# Patient Record
Sex: Female | Born: 1985
Health system: Southern US, Community
[De-identification: ages and names within clinical notes are randomized; demographics above are authoritative.]

## PROBLEM LIST (undated history)

## (undated) DIAGNOSIS — J309 Allergic rhinitis, unspecified: Secondary | ICD-10-CM

## (undated) DIAGNOSIS — N879 Dysplasia of cervix uteri, unspecified: Secondary | ICD-10-CM

## (undated) DIAGNOSIS — F419 Anxiety disorder, unspecified: Secondary | ICD-10-CM

## (undated) DIAGNOSIS — Z8619 Personal history of other infectious and parasitic diseases: Secondary | ICD-10-CM

## (undated) DIAGNOSIS — F32A Depression, unspecified: Secondary | ICD-10-CM

## (undated) HISTORY — DX: Personal history of other infectious and parasitic diseases: Z86.19

## (undated) HISTORY — DX: Allergic rhinitis, unspecified: J30.9

## (undated) HISTORY — DX: Dysplasia of cervix uteri, unspecified: N87.9

---
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
## 2010-01-01 ENCOUNTER — Ambulatory Visit: Payer: Self-pay | Admitting: Family Medicine

## 2010-04-23 ENCOUNTER — Ambulatory Visit (HOSPITAL_COMMUNITY): Admission: RE | Admit: 2010-04-23 | Discharge: 2010-04-23 | Payer: Self-pay | Admitting: Family Medicine

## 2010-06-27 ENCOUNTER — Inpatient Hospital Stay (HOSPITAL_COMMUNITY): Admission: AD | Admit: 2010-06-27 | Discharge: 2010-06-27 | Payer: Self-pay | Admitting: Obstetrics & Gynecology

## 2010-06-27 ENCOUNTER — Ambulatory Visit: Payer: Self-pay | Admitting: Obstetrics and Gynecology

## 2010-07-02 ENCOUNTER — Ambulatory Visit (HOSPITAL_COMMUNITY): Admission: RE | Admit: 2010-07-02 | Discharge: 2010-07-02 | Payer: Self-pay | Admitting: Family Medicine

## 2010-09-10 ENCOUNTER — Ambulatory Visit (HOSPITAL_COMMUNITY): Admission: RE | Admit: 2010-09-10 | Discharge: 2010-09-10 | Payer: Self-pay | Admitting: Family Medicine

## 2010-09-12 ENCOUNTER — Ambulatory Visit: Payer: Self-pay | Admitting: Obstetrics and Gynecology

## 2010-09-12 ENCOUNTER — Inpatient Hospital Stay (HOSPITAL_COMMUNITY)
Admission: AD | Admit: 2010-09-12 | Discharge: 2010-09-12 | Payer: Self-pay | Source: Home / Self Care | Admitting: Obstetrics & Gynecology

## 2010-09-12 ENCOUNTER — Inpatient Hospital Stay (HOSPITAL_COMMUNITY): Admission: AD | Admit: 2010-09-12 | Discharge: 2010-09-15 | Payer: Self-pay | Admitting: Obstetrics & Gynecology

## 2010-11-18 ENCOUNTER — Ambulatory Visit: Payer: Self-pay | Admitting: Family Medicine

## 2010-12-26 ENCOUNTER — Encounter: Payer: Self-pay | Admitting: *Deleted

## 2011-02-10 ENCOUNTER — Ambulatory Visit (INDEPENDENT_AMBULATORY_CARE_PROVIDER_SITE_OTHER): Payer: Medicaid Other | Admitting: Family Medicine

## 2011-02-10 DIAGNOSIS — J069 Acute upper respiratory infection, unspecified: Secondary | ICD-10-CM

## 2011-02-17 LAB — CBC
HCT: 37.8 % (ref 36.0–46.0)
Hemoglobin: 12.7 g/dL (ref 12.0–15.0)
MCH: 27.9 pg (ref 26.0–34.0)
MCHC: 33.6 g/dL (ref 30.0–36.0)
MCV: 83 fL (ref 78.0–100.0)
Platelets: 214 10*3/uL (ref 150–400)
RBC: 4.55 MIL/uL (ref 3.87–5.11)
RDW: 14.5 % (ref 11.5–15.5)
WBC: 13.8 10*3/uL — ABNORMAL HIGH (ref 4.0–10.5)

## 2011-02-17 LAB — RPR: RPR Ser Ql: NONREACTIVE

## 2011-02-19 LAB — URINALYSIS, ROUTINE W REFLEX MICROSCOPIC
Bilirubin Urine: NEGATIVE
Glucose, UA: NEGATIVE mg/dL
Hgb urine dipstick: NEGATIVE
Ketones, ur: 40 mg/dL — AB
Nitrite: NEGATIVE
Protein, ur: NEGATIVE mg/dL
Specific Gravity, Urine: <1.005 — ABNORMAL LOW (ref 1.005–1.030)
Urobilinogen, UA: 0.2 mg/dL (ref 0.0–1.0)
pH: 5.5 (ref 5.0–8.0)

## 2011-02-19 LAB — FETAL FIBRONECTIN: Fetal Fibronectin: NEGATIVE

## 2011-04-05 ENCOUNTER — Inpatient Hospital Stay (HOSPITAL_COMMUNITY): Admission: AD | Admit: 2011-04-05 | Payer: Self-pay | Source: Home / Self Care | Admitting: Obstetrics & Gynecology

## 2011-10-04 ENCOUNTER — Other Ambulatory Visit: Payer: BC Managed Care – PPO

## 2011-10-31 ENCOUNTER — Ambulatory Visit (INDEPENDENT_AMBULATORY_CARE_PROVIDER_SITE_OTHER): Payer: Self-pay | Admitting: Medical

## 2011-10-31 ENCOUNTER — Encounter: Payer: Self-pay | Admitting: Medical

## 2011-10-31 ENCOUNTER — Encounter: Payer: Self-pay | Admitting: Family Medicine

## 2011-10-31 VITALS — BP 90/58 | HR 88 | Temp 98.5°F | Resp 16 | Wt 179.0 lb

## 2011-10-31 DIAGNOSIS — H6690 Otitis media, unspecified, unspecified ear: Secondary | ICD-10-CM

## 2011-10-31 DIAGNOSIS — J069 Acute upper respiratory infection, unspecified: Secondary | ICD-10-CM

## 2011-10-31 MED ORDER — AMOXICILLIN 875 MG PO TABS: 875.0000 mg | ORAL_TABLET | Freq: Two times a day (BID) | ORAL | Status: AC

## 2011-10-31 NOTE — Patient Instructions (Signed)

## 2011-10-31 NOTE — Progress Notes (Signed)
Subjective:   HPI  Carol May is a 25 y.o. female who presents with three-day history of sore throat, hard to swallow, cough, runny nose, a few episodes of nausea and vomiting last 2 days. Using nothing for her symptoms. Denies sick contacts.  She is currently 4 months pregnant, and pregnancy has been uncomplicated.  No other aggravating or relieving factors.    No other c/o.  The following portions of the patient's history were reviewed and updated as appropriate: allergies, current medications, past family history, past medical history, past social history, past surgical history and problem list.  Past Medical History  Diagnosis Date  . Pregnancy as incidental finding    Review of Systems Constitutional: -fever, -chills, -sweats, -unexpected -weight change,-fatigue ENT: +runny nose, -ear pain, +sore throat Cardiology:  -chest pain, -palpitations, -edema Respiratory: +cough, -shortness of breath, -wheezing Gastroenterology: -abdominal pain, -nausea, +vomiting, -diarrhea, -constipation Hematology: -bleeding or bruising problems Musculoskeletal: -arthralgias, -myalgias, -joint swelling, -back pain Ophthalmology: -vision changes Urology: -dysuria, -difficulty urinating, -hematuria, -urinary frequency, -urgency Neurology: -headache, -weakness, -tingling, -numbness    Objective:   Filed Vitals:   10/31/11 1355  BP: 90/58  Pulse: 88  Temp: 98.5 F (36.9 C)  Resp: 16    General appearance: Alert, WD/WN, no distress, mildly ill appearing                             Skin: warm, no rash                           Head: no sinus tenderness                            Eyes: conjunctiva normal, corneas clear, PERRLA                            Ears: right TM with mild erythema, left TM pearly                           Nose: septum midline, turbinates swollen, with erythema and clear discharge             Mouth/throat: MMM, tongue normal, mild pharyngeal erythema             Neck: supple, no adenopathy, no thyromegaly, nontender                          Heart: RRR, normal S1, S2, no murmurs                         Lungs: CTA bilaterally, no wheezes, rales, or rhonchi     Assessment and Plan:   Encounter Diagnoses  Name Primary?  . Otitis media Yes  . URI (upper respiratory infection)    Discussed diagnosis and treatment of URI and mild otitis media.  Suggested symptomatic OTC remedies.  If your ear pain worsens or symptoms worsen in general can begin amoxicillin, otherwise we will treat this as conservatively as possible given that she is pregnant.  Nasal saline spray for congestion.  Tylenol or Ibuprofen OTC for fever and malaise.  Call/return in 2-3 days if symptoms aren't resolving.  Prescription for amoxicillin given.

## 2011-11-30 LAB — CBC
HCT: 36 % (ref 36–46)
Hemoglobin: 12.3 g/dL (ref 12.0–16.0)
Platelets: 195 10*3/uL (ref 150–399)

## 2011-11-30 LAB — ABO/RH: RH Type: POSITIVE

## 2011-11-30 LAB — SICKLE CELL SCREEN: Sickle Cell Screen: NORMAL

## 2011-11-30 LAB — RUBELLA ANTIBODY, IGM: Rubella: IMMUNE

## 2011-11-30 LAB — HIV ANTIBODY (ROUTINE TESTING W REFLEX)
HIV: NONREACTIVE
HIV: NONREACTIVE

## 2011-11-30 LAB — HEPATITIS B SURFACE ANTIGEN: Hepatitis B Surface Ag: NEGATIVE

## 2011-11-30 LAB — RPR: RPR: NONREACTIVE

## 2011-11-30 LAB — ANTIBODY SCREEN: Antibody Screen: NEGATIVE

## 2011-11-30 LAB — VARICELLA ZOSTER ANTIBODY, IGG: Varicella: IMMUNE

## 2011-12-06 NOTE — L&D Delivery Note (Signed)
Delivery Note At 3:38 PM a viable female was delivered via  (LOA, easy delivery of the head, easy delivery of shoulders, baby placed on pt abd after bulb suction, cord doubly clamped and cut by FOB ).  APGAR:8, 9 ; weight pending Placenta status: schultze 3 vessel cord Anesthesia: Epidural  Episiotomy: none Lacerations: none Suture Repair: none Est. Blood Loss (mL): 200  Mom to postpartum.  Baby to rooming in does not plan circumcision.  Romulo Okray 04/21/2012, 4:02 PM

## 2011-12-08 LAB — GC/CHLAMYDIA PROBE AMP, GENITAL
Chlamydia: NEGATIVE
Gonorrhea: NEGATIVE

## 2012-02-27 ENCOUNTER — Encounter (INDEPENDENT_AMBULATORY_CARE_PROVIDER_SITE_OTHER): Payer: Medicaid Other | Admitting: Obstetrics and Gynecology

## 2012-02-27 DIAGNOSIS — Z331 Pregnant state, incidental: Secondary | ICD-10-CM

## 2012-02-27 LAB — RPR: RPR: NONREACTIVE

## 2012-02-27 LAB — CBC
HCT: 34 % — AB (ref 36–46)
Hemoglobin: 10.7 g/dL — AB (ref 12.0–16.0)

## 2012-03-13 ENCOUNTER — Ambulatory Visit (INDEPENDENT_AMBULATORY_CARE_PROVIDER_SITE_OTHER): Payer: Medicaid Other | Admitting: Obstetrics and Gynecology

## 2012-03-13 ENCOUNTER — Encounter: Payer: Self-pay | Admitting: Obstetrics and Gynecology

## 2012-03-13 VITALS — BP 92/62 | Ht 62.0 in | Wt 188.0 lb

## 2012-03-13 DIAGNOSIS — Z331 Pregnant state, incidental: Secondary | ICD-10-CM

## 2012-03-13 NOTE — Progress Notes (Signed)
No complaints FKCs GBS at [place] Glucola tomorrow (not resulted by lab at last visit)

## 2012-03-14 ENCOUNTER — Other Ambulatory Visit: Payer: Medicaid Other

## 2012-03-15 ENCOUNTER — Other Ambulatory Visit (INDEPENDENT_AMBULATORY_CARE_PROVIDER_SITE_OTHER): Payer: Medicaid Other

## 2012-03-15 DIAGNOSIS — Z331 Pregnant state, incidental: Secondary | ICD-10-CM

## 2012-03-15 LAB — POCT UA - GLUCOSE/PROTEIN
Glucose, UA: NEGATIVE
Protein, UA: NEGATIVE

## 2012-03-16 LAB — GLUCOSE TOLERANCE, 1 HOUR: Glucose, 1 Hour GTT: 82 mg/dL (ref 70–140)

## 2012-03-27 ENCOUNTER — Encounter: Payer: Medicaid Other | Admitting: Obstetrics and Gynecology

## 2012-03-28 ENCOUNTER — Ambulatory Visit (INDEPENDENT_AMBULATORY_CARE_PROVIDER_SITE_OTHER): Payer: Medicaid Other | Admitting: Obstetrics and Gynecology

## 2012-03-28 ENCOUNTER — Encounter: Payer: Self-pay | Admitting: Obstetrics and Gynecology

## 2012-03-28 VITALS — BP 96/58 | Wt 189.0 lb

## 2012-03-28 DIAGNOSIS — Z331 Pregnant state, incidental: Secondary | ICD-10-CM

## 2012-03-28 NOTE — Progress Notes (Signed)
No complaints Labor signs reviewed GBS/GC/Chlamydia done Glucola 82

## 2012-03-28 NOTE — Progress Notes (Signed)
Pt. Stated no issues today / gbs today.

## 2012-03-29 LAB — GC/CHLAMYDIA PROBE AMP, GENITAL
Chlamydia, DNA Probe: NEGATIVE
GC Probe Amp, Genital: NEGATIVE

## 2012-03-30 LAB — STREP B DNA PROBE: GBSP: POSITIVE

## 2012-04-04 ENCOUNTER — Encounter: Payer: Medicaid Other | Admitting: Obstetrics and Gynecology

## 2012-04-05 ENCOUNTER — Ambulatory Visit (INDEPENDENT_AMBULATORY_CARE_PROVIDER_SITE_OTHER): Payer: Medicaid Other | Admitting: Obstetrics and Gynecology

## 2012-04-05 ENCOUNTER — Encounter: Payer: Self-pay | Admitting: Obstetrics and Gynecology

## 2012-04-05 VITALS — BP 110/60 | Wt 190.0 lb

## 2012-04-05 DIAGNOSIS — Z3480 Encounter for supervision of other normal pregnancy, unspecified trimester: Secondary | ICD-10-CM

## 2012-04-05 NOTE — Progress Notes (Signed)
No complaints today but feels her cervix opening.  GBSpos/ GCneg/CT neg Doing Well FKCs and Labor Precautions RTO 1wk

## 2012-04-09 ENCOUNTER — Telehealth: Payer: Self-pay | Admitting: Obstetrics and Gynecology

## 2012-04-09 NOTE — Telephone Encounter (Signed)
In epic

## 2012-04-09 NOTE — Telephone Encounter (Signed)
Spoke with pt rgd msg informed pt ok to take plain claratin or plain zytrec pt voice understanding

## 2012-04-11 ENCOUNTER — Ambulatory Visit (INDEPENDENT_AMBULATORY_CARE_PROVIDER_SITE_OTHER): Payer: Medicaid Other | Admitting: Obstetrics and Gynecology

## 2012-04-11 ENCOUNTER — Encounter: Payer: Self-pay | Admitting: Obstetrics and Gynecology

## 2012-04-11 VITALS — BP 110/62 | Wt 193.0 lb

## 2012-04-11 DIAGNOSIS — Z331 Pregnant state, incidental: Secondary | ICD-10-CM

## 2012-04-11 NOTE — Progress Notes (Signed)
URI VS allergies, c/o mucous. Suggest Zyrtec +/- Mucinex

## 2012-04-11 NOTE — Progress Notes (Signed)
Pt c/o pain in lower abd almost every day lasting around 15 min. Pt also complains of cold sx, congestion. Wanting to know otc meds that can be taken. Pt declines VE

## 2012-04-18 ENCOUNTER — Encounter: Payer: Self-pay | Admitting: Obstetrics and Gynecology

## 2012-04-18 ENCOUNTER — Ambulatory Visit (INDEPENDENT_AMBULATORY_CARE_PROVIDER_SITE_OTHER): Payer: Medicaid Other | Admitting: Obstetrics and Gynecology

## 2012-04-18 VITALS — BP 98/52 | Wt 193.0 lb

## 2012-04-18 DIAGNOSIS — Z331 Pregnant state, incidental: Secondary | ICD-10-CM

## 2012-04-18 NOTE — Progress Notes (Signed)
Well. No complaints. Increased discharge.

## 2012-04-18 NOTE — Progress Notes (Signed)
Pt has no complaints and would like cx checked JM

## 2012-04-21 ENCOUNTER — Encounter (HOSPITAL_COMMUNITY): Payer: Self-pay | Admitting: *Deleted

## 2012-04-21 ENCOUNTER — Encounter (HOSPITAL_COMMUNITY): Payer: Self-pay

## 2012-04-21 ENCOUNTER — Encounter (HOSPITAL_COMMUNITY): Payer: Self-pay | Admitting: Anesthesiology

## 2012-04-21 ENCOUNTER — Inpatient Hospital Stay (HOSPITAL_COMMUNITY)
Admission: AD | Admit: 2012-04-21 | Discharge: 2012-04-22 | DRG: 775 | Disposition: A | Discharge status: Home / Self Care | Payer: Medicaid Other | Source: Ambulatory Visit | Attending: Obstetrics and Gynecology | Admitting: Obstetrics and Gynecology

## 2012-04-21 ENCOUNTER — Inpatient Hospital Stay (HOSPITAL_COMMUNITY): Payer: Medicaid Other | Admitting: Anesthesiology

## 2012-04-21 DIAGNOSIS — O99892 Other specified diseases and conditions complicating childbirth: Principal | ICD-10-CM | POA: Diagnosis present

## 2012-04-21 DIAGNOSIS — IMO0001 Reserved for inherently not codable concepts without codable children: Secondary | ICD-10-CM

## 2012-04-21 DIAGNOSIS — O99820 Streptococcus B carrier state complicating pregnancy: Secondary | ICD-10-CM

## 2012-04-21 DIAGNOSIS — Z22330 Carrier of Group B streptococcus: Secondary | ICD-10-CM

## 2012-04-21 LAB — CBC
HCT: 34.8 % — ABNORMAL LOW (ref 36.0–46.0)
Hemoglobin: 11.2 g/dL — ABNORMAL LOW (ref 12.0–15.0)
MCH: 26.4 pg (ref 26.0–34.0)
MCHC: 32.2 g/dL (ref 30.0–36.0)
MCV: 82.1 fL (ref 78.0–100.0)
Platelets: 181 10*3/uL (ref 150–400)
RBC: 4.24 MIL/uL (ref 3.87–5.11)
RDW: 14.1 % (ref 11.5–15.5)
WBC: 10.1 10*3/uL (ref 4.0–10.5)

## 2012-04-21 LAB — RPR: RPR Ser Ql: NONREACTIVE

## 2012-04-21 MED ORDER — PRENATAL MULTIVITAMIN CH
1.0000 | ORAL_TABLET | Freq: Every day | ORAL | Status: DC
  Administered 2012-04-22: 1 via ORAL
  Filled 2012-04-21: qty 1

## 2012-04-21 MED ORDER — LIDOCAINE HCL (PF) 1 % IJ SOLN
INTRAMUSCULAR | Status: DC | PRN
  Administered 2012-04-21 (×2): 4 mL

## 2012-04-21 MED ORDER — ONDANSETRON HCL 4 MG/2ML IJ SOLN: 4.0000 mg | INTRAMUSCULAR | Status: DC | PRN

## 2012-04-21 MED ORDER — IBUPROFEN 600 MG PO TABS
600.0000 mg | ORAL_TABLET | Freq: Four times a day (QID) | ORAL | Status: DC | PRN
  Administered 2012-04-21: 600 mg via ORAL
  Filled 2012-04-21: qty 1

## 2012-04-21 MED ORDER — OXYCODONE-ACETAMINOPHEN 5-325 MG PO TABS: 1.0000 | ORAL_TABLET | ORAL | Status: DC | PRN

## 2012-04-21 MED ORDER — EPHEDRINE 5 MG/ML INJ
10.0000 mg | INTRAVENOUS | Status: DC | PRN
  Filled 2012-04-21: qty 4

## 2012-04-21 MED ORDER — LACTATED RINGERS IV SOLN
500.0000 mL | Freq: Once | INTRAVENOUS | Status: AC
  Administered 2012-04-21: 500 mL via INTRAVENOUS

## 2012-04-21 MED ORDER — DIPHENHYDRAMINE HCL 50 MG/ML IJ SOLN: 12.5000 mg | INTRAMUSCULAR | Status: DC | PRN

## 2012-04-21 MED ORDER — PENICILLIN G POTASSIUM 5000000 UNITS IJ SOLR
5.0000 10*6.[IU] | Freq: Once | INTRAVENOUS | Status: AC
  Administered 2012-04-21: 5 10*6.[IU] via INTRAVENOUS
  Filled 2012-04-21: qty 5

## 2012-04-21 MED ORDER — BENZOCAINE-MENTHOL 20-0.5 % EX AERO: 1.0000 "application " | INHALATION_SPRAY | CUTANEOUS | Status: DC | PRN

## 2012-04-21 MED ORDER — PENICILLIN G POTASSIUM 5000000 UNITS IJ SOLR
2.5000 10*6.[IU] | INTRAVENOUS | Status: DC
  Administered 2012-04-21 (×2): 2.5 10*6.[IU] via INTRAVENOUS
  Filled 2012-04-21 (×5): qty 2.5

## 2012-04-21 MED ORDER — WITCH HAZEL-GLYCERIN EX PADS: 1.0000 "application " | MEDICATED_PAD | CUTANEOUS | Status: DC | PRN

## 2012-04-21 MED ORDER — OXYTOCIN 20 UNITS IN LACTATED RINGERS INFUSION - SIMPLE
125.0000 mL/h | Freq: Once | INTRAVENOUS | Status: AC
  Administered 2012-04-21: 125 mL/h via INTRAVENOUS

## 2012-04-21 MED ORDER — BUTORPHANOL TARTRATE 2 MG/ML IJ SOLN: 1.0000 mg | INTRAMUSCULAR | Status: DC | PRN

## 2012-04-21 MED ORDER — FENTANYL 2.5 MCG/ML BUPIVACAINE 1/10 % EPIDURAL INFUSION (WH - ANES)
INTRAMUSCULAR | Status: DC | PRN
  Administered 2012-04-21: 13 mL/h via EPIDURAL

## 2012-04-21 MED ORDER — ONDANSETRON HCL 4 MG PO TABS: 4.0000 mg | ORAL_TABLET | ORAL | Status: DC | PRN

## 2012-04-21 MED ORDER — FENTANYL 2.5 MCG/ML BUPIVACAINE 1/10 % EPIDURAL INFUSION (WH - ANES)
14.0000 mL/h | INTRAMUSCULAR | Status: DC
  Administered 2012-04-21 (×2): 14 mL/h via EPIDURAL
  Filled 2012-04-21 (×3): qty 60

## 2012-04-21 MED ORDER — LACTATED RINGERS IV SOLN
500.0000 mL | INTRAVENOUS | Status: DC | PRN
Start: 2012-04-21 — End: 2012-04-21

## 2012-04-21 MED ORDER — LIDOCAINE HCL (PF) 1 % IJ SOLN: 30.0000 mL | INTRAMUSCULAR | Status: DC | PRN

## 2012-04-21 MED ORDER — FLEET ENEMA 7-19 GM/118ML RE ENEM: 1.0000 | ENEMA | RECTAL | Status: DC | PRN

## 2012-04-21 MED ORDER — DIPHENHYDRAMINE HCL 25 MG PO CAPS: 25.0000 mg | ORAL_CAPSULE | Freq: Four times a day (QID) | ORAL | Status: DC | PRN

## 2012-04-21 MED ORDER — LACTATED RINGERS IV SOLN
INTRAVENOUS | Status: DC
  Administered 2012-04-21 (×2): via INTRAVENOUS

## 2012-04-21 MED ORDER — TERBUTALINE SULFATE 1 MG/ML IJ SOLN: 0.2500 mg | Freq: Once | INTRAMUSCULAR | Status: DC | PRN

## 2012-04-21 MED ORDER — SIMETHICONE 80 MG PO CHEW: 80.0000 mg | CHEWABLE_TABLET | ORAL | Status: DC | PRN

## 2012-04-21 MED ORDER — ZOLPIDEM TARTRATE 5 MG PO TABS: 5.0000 mg | ORAL_TABLET | Freq: Every evening | ORAL | Status: DC | PRN

## 2012-04-21 MED ORDER — EPHEDRINE 5 MG/ML INJ: 10.0000 mg | INTRAVENOUS | Status: DC | PRN

## 2012-04-21 MED ORDER — OXYTOCIN 20 UNITS IN LACTATED RINGERS INFUSION - SIMPLE
1.0000 m[IU]/min | INTRAVENOUS | Status: DC
Start: 2012-04-21 — End: 2012-04-21
  Administered 2012-04-21: 2 m[IU]/min via INTRAVENOUS

## 2012-04-21 MED ORDER — TETANUS-DIPHTH-ACELL PERTUSSIS 5-2.5-18.5 LF-MCG/0.5 IM SUSP
0.5000 mL | Freq: Once | INTRAMUSCULAR | Status: AC
  Administered 2012-04-22: 0.5 mL via INTRAMUSCULAR
  Filled 2012-04-21: qty 0.5

## 2012-04-21 MED ORDER — ACETAMINOPHEN 325 MG PO TABS: 650.0000 mg | ORAL_TABLET | ORAL | Status: DC | PRN

## 2012-04-21 MED ORDER — IBUPROFEN 600 MG PO TABS
600.0000 mg | ORAL_TABLET | Freq: Four times a day (QID) | ORAL | Status: DC
  Administered 2012-04-22 (×3): 600 mg via ORAL
  Filled 2012-04-21 (×3): qty 1

## 2012-04-21 MED ORDER — CITRIC ACID-SODIUM CITRATE 334-500 MG/5ML PO SOLN: 30.0000 mL | ORAL | Status: DC | PRN

## 2012-04-21 MED ORDER — ONDANSETRON HCL 4 MG/2ML IJ SOLN: 4.0000 mg | Freq: Four times a day (QID) | INTRAMUSCULAR | Status: DC | PRN

## 2012-04-21 MED ORDER — PHENYLEPHRINE 40 MCG/ML (10ML) SYRINGE FOR IV PUSH (FOR BLOOD PRESSURE SUPPORT)
80.0000 ug | PREFILLED_SYRINGE | INTRAVENOUS | Status: DC | PRN
  Filled 2012-04-21: qty 5

## 2012-04-21 MED ORDER — PHENYLEPHRINE 40 MCG/ML (10ML) SYRINGE FOR IV PUSH (FOR BLOOD PRESSURE SUPPORT): 80.0000 ug | PREFILLED_SYRINGE | INTRAVENOUS | Status: DC | PRN

## 2012-04-21 MED ORDER — OXYTOCIN BOLUS FROM INFUSION
500.0000 mL | Freq: Once | INTRAVENOUS | Status: DC
  Filled 2012-04-21: qty 1000
  Filled 2012-04-21: qty 500

## 2012-04-21 MED ORDER — SENNOSIDES-DOCUSATE SODIUM 8.6-50 MG PO TABS
2.0000 | ORAL_TABLET | Freq: Every day | ORAL | Status: DC
Start: 2012-04-21 — End: 2012-04-22
  Administered 2012-04-21: 2 via ORAL

## 2012-04-21 MED ORDER — LANOLIN HYDROUS EX OINT: TOPICAL_OINTMENT | CUTANEOUS | Status: DC | PRN

## 2012-04-21 MED ORDER — DIBUCAINE 1 % RE OINT: 1.0000 "application " | TOPICAL_OINTMENT | RECTAL | Status: DC | PRN

## 2012-04-21 NOTE — Anesthesia Postprocedure Evaluation (Signed)
  Anesthesia Post-op Note  Patient: Carol May  Procedure(s) Performed: * No procedures listed *  Patient Location: PACU and Mother/Baby  Anesthesia Type: Epidural  Level of Consciousness: awake, alert  and oriented  Airway and Oxygen Therapy: Patient Spontanous Breathing     Post-op Assessment: Patient's Cardiovascular Status Stable and Respiratory Function Stable  Post-op Vital Signs: stable  Complications: No apparent anesthesia complications

## 2012-04-21 NOTE — Progress Notes (Signed)
Delivery of live viable female by Maryln Manuel, CNM. APGARS 8,9

## 2012-04-21 NOTE — Progress Notes (Signed)
Comfortable with epidural O VSS     Fhts 120 LTV Mod     abd soft, gravid, nt     uc q 3 mild to mod     Vag 5 90 -1 VTX arm mod amount clear fluid A not in active labor     Probable inadequate uc P ARM and IUPC placed with pt consent, continue care Lavera Guise, CNM

## 2012-04-21 NOTE — Progress Notes (Addendum)
Comfortable with epidural O VSS     Fhs 130s LTV Mod     uc q 5-7 mod     abd soft between uc     Vag 5 80 -2 VTX I A inadequate uc P IV pitocin, arm if indicated, pt concurs with plan of care, continue care, Lavera Guise, CNM EFW 7#12

## 2012-04-21 NOTE — Progress Notes (Signed)
Comfortable, some pressure O VSS     Fhts cateory 1     abd soft between uc     uc q 2-3 adequate      Vag 6 100 -1 VTX R clear A active labor P continue care Rosette Reveal, CNM

## 2012-04-21 NOTE — MAU Note (Signed)
Patient is here for labor eval. She states that ctx q3-34mins since midnight. She reports good fetal movement. Denies any vaginal bleeding or lof.

## 2012-04-21 NOTE — Anesthesia Procedure Notes (Signed)
Epidural Patient location during procedure: OB Start time: 04/21/2012 5:37 AM  Staffing Anesthesiologist: Relda Agosto A. Performed by: anesthesiologist   Preanesthetic Checklist Completed: patient identified, site marked, surgical consent, pre-op evaluation, timeout performed, IV checked, risks and benefits discussed and monitors and equipment checked  Epidural Patient position: sitting Prep: site prepped and draped and DuraPrep Patient monitoring: continuous pulse ox and blood pressure Approach: midline Injection technique: LOR air  Needle:  Needle type: Tuohy  Needle gauge: 17 G Needle length: 9 cm Needle insertion depth: 8 cm Catheter type: closed end flexible Catheter size: 19 Gauge Catheter at skin depth: 13 cm Test dose: negative and Other  Assessment Events: blood not aspirated, injection not painful, no injection resistance, negative IV test and no paresthesia  Additional Notes Patient identified. Risks and benefits discussed including failed block, incomplete  Pain control, post dural puncture headache, nerve damage, paralysis, blood pressure Changes, nausea, vomiting, reactions to medications-both toxic and allergic and post Partum back pain. All questions were answered. Patient expressed understanding and wished to proceed. Sterile technique was used throughout procedure. Epidural site was Dressed with sterile barrier dressing. No paresthesias, signs of intravascular injection Or signs of intrathecal spread were encountered.  Patient was more comfortable after the epidural was dosed. Please see RN's note for documentation of vital signs and FHR which are stable.

## 2012-04-21 NOTE — H&P (Signed)
Carol May is a 26 y.o. female, G2P1001 at 40 weeks, presenting with contractions every 3-5 minutes x several hours.  Denies leaking or bleeding, reports +FM.  Cervix was 1 cm on last exam.  Pregnancy remarkable for: Late to care at 20 weeks . History of present pregnancy: Patient entered care at 20 weeks.  EDC of 04/21/12 was established by LMP and in agreement with 20 week [place].  Anatomy scan was done at 20-21 weeks, with normal findings and an anterior/fundal placenta.   Her prenatal course was essentially uncomplicated.  Further signficant events were positive GBS noted at 36 weeks. At her last evalution, she was 1 cm.  History OB History    Grav Para Term Preterm Abortions TAB SAB Ect Mult Living   2 1 1  0 0 0 0 0 0 1    #1--2011.. SVB female, 7+5 at 41 weeks.  9 hour labor, epidural. #2--Current.  Same partner  Past Medical History  Diagnosis Date  . Pregnancy as incidental finding   . No pertinent past medical history   . Bacterial vaginosis   . H/O varicella    Past Surgical History  Procedure Date  . No past surgeries    Family History: family history is not on file.  Non-contributory. Social History:  reports that she has never smoked. She does not have any smokeless tobacco history on file. She reports that she does not drink alcohol or use illicit drugs.  Patient is Hispanic in ethnicity, of the [facility] faith, and has 2 years of college. She is a SAHM.  FOB, Collier Bullock, is high-school educated and employed as a Designer, fashion/clothing.  ROS:  Contractions q 3-5 minutes, + FM.  Dilation: 3.5 Effacement (%): 80 Station: -1 Exam by:: v. Rapheal Masso cnm Blood pressure 113/71, pulse 84, temperature 97.4 F (36.3 C), temperature source Oral, resp. rate 18, last menstrual period 07/16/2011.  Exam Physical Exam  Chest clear Heart RRR without murmur Abd gravid, NT Pelvic--as above, with intact BOW, cervix slightly posterior Ext WNL  FHR reactive, with baseline 100-110 UCs q  3-5 minutes, moderate quality.  Prenatal labs: ABO, Rh: A/Positive/-- (12/26 0000) Antibody: Negative (12/26 0000) Rubella: Immune (12/26 0000) RPR: Nonreactive (03/25 0000)  HBsAg: Negative (12/26 0000)  HIV: Non-reactive, Non-reactive (12/26 0000)  GBS: POSITIVE (04/24 1412)  Hgb 12.3 at NOB/10.7 at 28 weeks Glucola WNL. GC, chlamydia negative at NOB and 36 weeks Declined genetic screening  Assessment/Plan: IUP at 40 weeks Early labor Positive GBS Low FHR baseline, but reactive  Plan: Admit to [facility] per consult with Dr. Stefano Gaul. Routine [facility] orders Plan continuous monitoring GBS prophylaxis with PCN G. Epidural prn as pt desires. Will observe labor progress, with AROM as appropriate.   Nigel Bridgeman 04/21/2012, 4:38 AM

## 2012-04-21 NOTE — Progress Notes (Signed)
Comfortable, some pressure O VSS      Fhts 120s LTV mod      uc q 2-3 50 mmHg, 250 MVU      Vag by RN at 1425 7 100 -1 A adequate uc     Active labor P continue care, Lavera Guise, CNM

## 2012-04-21 NOTE — Progress Notes (Signed)
Dr. Stefano Gaul updated per telephone on pt status per last note, will continue care. Lavera Guise, CNM

## 2012-04-21 NOTE — Anesthesia Preprocedure Evaluation (Signed)

## 2012-04-22 ENCOUNTER — Encounter (HOSPITAL_COMMUNITY): Payer: Self-pay | Admitting: *Deleted

## 2012-04-22 LAB — CBC
HCT: 31.6 % — ABNORMAL LOW (ref 36.0–46.0)
Hemoglobin: 10.2 g/dL — ABNORMAL LOW (ref 12.0–15.0)
MCH: 26.5 pg (ref 26.0–34.0)
MCHC: 32.3 g/dL (ref 30.0–36.0)
MCV: 82.1 fL (ref 78.0–100.0)
Platelets: 155 10*3/uL (ref 150–400)
RBC: 3.85 MIL/uL — ABNORMAL LOW (ref 3.87–5.11)
RDW: 14 % (ref 11.5–15.5)
WBC: 11.8 10*3/uL — ABNORMAL HIGH (ref 4.0–10.5)

## 2012-04-22 MED ORDER — IBUPROFEN 600 MG PO TABS: 600.0000 mg | ORAL_TABLET | Freq: Four times a day (QID) | ORAL | Status: AC | PRN

## 2012-04-22 NOTE — Progress Notes (Signed)
S: comfortable, little bleeding, slept     breastfeeding O VSS     abd soft, nt, ff      sm  flowperineum clean intact     -Homans sign bilaterally,       No edema A normal involution     Lactating     PP day 1 P continue care, discussed birth control options undecided Lavera Guise, CNM

## 2012-04-22 NOTE — Discharge Summary (Signed)
Physician Discharge Summary  Patient ID: Carol May MRN: [id number] DOB/AGE: 1986-11-07 26 y.o.  Admit date: 04/21/2012 Discharge date: 04/22/2012  Admission Diagnoses:40 week IUP early labor  Discharge Diagnoses:  Active Problems:  Vaginal delivery normal involution lactating Discharged Condition: stable  Hospital Course: 40 week IUP spontaneous labor, vag delivery intact, normal involution lactating  Consults: None  Significant Diagnostic Studies: labs:  Hemoglobin & Hematocrit     Component Value Date/Time   HGB 10.2* 04/22/2012 0529   HCT 31.6* 04/22/2012 0529     Treatments: IV hydration  Discharge Exam: Blood pressure 110/75, pulse 72, temperature 98.3 F (36.8 C), temperature source Oral, resp. rate 18, height 5\' 2"  (1.575 m), weight 193 lb (87.544 kg), last menstrual period 07/16/2011, unknown if currently breastfeeding. General appearance: alert, cooperative and no distress see PE progress note  Disposition:   Discharge Orders    Future Appointments: Provider: Department: Dept Phone: Center:   04/24/2012 11:40 AM Esmeralda Arthur, MD [facility] [phone] None     Medication List  As of 04/22/2012  4:33 PM   STOP taking these medications         prenatal multivitamin 60-1 MG tablet         TAKE these medications         ibuprofen 600 MG tablet   Commonly known as: ADVIL,MOTRIN   Take 1 tablet (600 mg total) by mouth every 6 (six) hours as needed for pain.           Follow-up Information    Follow up in 6 weeks.       undecided regarding birth control, [facility] handbook.  SignedLavera Guise 04/22/2012, 4:33 PM

## 2012-04-22 NOTE — Discharge Instructions (Signed)
Vaginal Delivery Care After  Change your pad on each trip to the bathroom.   Wipe gently with toilet paper during your hospital stay. Always wipe from front to back. A spray bottle with warm tap water could also be used or a towelette if available.   Place your soiled pad and toilet paper in a bathroom wastebasket with a plastic bag liner.   During your hospital stay, save any clots. If you pass a clot while on the toilet, do not flush it. Also, if your vaginal flow seems excessive to you, notify nursing personnel.   The first time you get out of bed after delivery, wait for assistance from a nurse. Do not get up alone at any time if you feel weak or dizzy.   Bend and extend your ankles forcefully so that you feel the calves of your legs get hard. Do this 6 times every hour when you are in bed and awake.   Do not sit with one foot under you, dangle your legs over the edge of the bed, or maintain a position that hinders the circulation in your legs.   Many women experience after pains for 2 to 3 days after delivery. These after pains are mild uterine contractions. Ask the nurse for a pain medication if you need something for this. Sometimes breastfeeding stimulates after pains; if you find this to be true, ask for the medication  -  hour before the next feeding.   For you and your infant's protection, do not go beyond the door(s) of the obstetric unit. Do not carry your baby in your arms in the hallway. When taking your baby to and from your room, put your baby in the bassinet and push the bassinet.   Mothers may have their babies in their room as much as they desire.  Document Released: 11/18/2000 Document Revised: 11/10/2011 Document Reviewed: 10/19/2007 Exit[employer] Patient Information 2012 [employer]. Breastfeeding BENEFITS OF BREASTFEEDING For the baby  The first milk (colostrum) helps the baby's digestive system function better.   There are antibodies from the mother in the milk  that help the baby fight off infections.   The baby has a lower incidence of asthma, allergies, and SIDS (sudden infant death syndrome).   The nutrients in breast milk are better than formulas for the baby and helps the baby's brain grow better.   Babies who breastfeed have less gas, colic, and constipation.  For the mother  Breastfeeding helps develop a very special bond between mother and baby.   It is more convenient, always available at the correct temperature and cheaper than formula feeding.   It burns calories in the mother and helps with losing weight that was gained during pregnancy.   It makes the uterus contract back down to normal size faster and slows bleeding following delivery.   Breastfeeding mothers have a lower risk of developing breast cancer.  NURSE FREQUENTLY  A healthy, full-term baby may breastfeed as often as every hour or space his or her feedings to every 3 hours.   How often to nurse will vary from baby to baby. Watch your baby for signs of hunger, not the clock.   Nurse as often as the baby requests, or when you feel the need to reduce the fullness of your breasts.   Awaken the baby if it has been 3 to 4 hours since the last feeding.   Frequent feeding will help the mother make more milk and will prevent problems like   sore nipples and engorgement of the breasts.  BABY'S POSITION AT THE BREAST  Whether lying down or sitting, be sure that the baby's tummy is facing your tummy.   Support the breast with 4 fingers underneath the breast and the thumb above. Make sure your fingers are well away from the nipple and baby's mouth.   Stroke the baby's lips and cheek closest to the breast gently with your finger or nipple.   When the baby's mouth is open wide enough, place all of your nipple and as much of the dark area around the nipple as possible into your baby's mouth.   Pull the baby in close so the tip of the nose and the baby's cheeks touch the breast  during the feeding.  FEEDINGS  The length of each feeding varies from baby to baby and from feeding to feeding.   The baby must suck about 2 to 3 minutes for your milk to get to him or her. This is called a "let down." For this reason, allow the baby to feed on each breast as long as he or she wants. Your baby will end the feeding when he or she has received the right balance of nutrients.   To break the suction, put your finger into the corner of the baby's mouth and slide it between his or her gums before removing your breast from his or her mouth. This will help prevent sore nipples.  REDUCING BREAST ENGORGEMENT  In the first week after your baby is born, you may experience signs of breast engorgement. When breasts are engorged, they feel heavy, warm, full, and may be tender to the touch. You can reduce engorgement if you:   Nurse frequently, every 2 to 3 hours. Mothers who breastfeed early and often have fewer problems with engorgement.   Place light ice packs on your breasts between feedings. This reduces swelling. Wrap the ice packs in a lightweight towel to protect your skin.   Apply moist hot packs to your breast for 5 to 10 minutes before each feeding. This increases circulation and helps the milk flow.   Gently massage your breast before and during the feeding.   Make sure that the baby empties at least one breast at every feeding before switching sides.   Use a breast pump to empty the breasts if your baby is sleepy or not nursing well. You may also want to pump if you are returning to work or or you feel you are getting engorged.   Avoid bottle feeds, pacifiers or supplemental feedings of water or juice in place of breastfeeding.   Be sure the baby is latched on and positioned properly while breastfeeding.   Prevent fatigue, stress, and anemia.   Wear a supportive bra, avoiding underwire styles.   Eat a balanced diet with enough fluids.  If you follow these suggestions,  your engorgement should improve in 24 to 48 hours. If you are still experiencing difficulty, call your lactation consultant or caregiver. IS MY BABY GETTING ENOUGH MILK? Sometimes, mothers worry about whether their babies are getting enough milk. You can be assured that your baby is getting enough milk if:  The baby is actively sucking and you hear swallowing.   The baby nurses at least 8 to 12 times in a 24 hour time period. Nurse your baby until he or she unlatches or falls asleep at the first breast (at least 10 to 20 minutes), then offer the second side.   The baby   is wetting 5 to 6 disposable diapers (6 to 8 cloth diapers) in a 24 hour period by 5 to 6 days of age.   The baby is having at least 2 to 3 stools every 24 hours for the first few months. Breast milk is all the food your baby needs. It is not necessary for your baby to have water or formula. In fact, to help your breasts make more milk, it is best not to give your baby supplemental feedings during the early weeks.   The stool should be soft and yellow.   The baby should gain 4 to 7 ounces per week after he is 4 days old.  TAKE CARE OF YOURSELF Take care of your breasts by:  Bathing or showering daily.   Avoiding the use of soaps on your nipples.   Start feedings on your left breast at one feeding and on your right breast at the next feeding.   You will notice an increase in your milk supply 2 to 5 days after delivery. You may feel some discomfort from engorgement, which makes your breasts very firm and often tender. Engorgement "peaks" out within 24 to 48 hours. In the meantime, apply warm moist towels to your breasts for 5 to 10 minutes before feeding. Gentle massage and expression of some milk before feeding will soften your breasts, making it easier for your baby to latch on. Wear a well fitting nursing bra and air dry your nipples for 10 to 15 minutes after each feeding.   Only use cotton bra pads.   Only use pure  lanolin on your nipples after nursing. You do not need to wash it off before nursing.  Take care of yourself by:   Eating well-balanced meals and nutritious snacks.   Drinking milk, fruit juice, and water to satisfy your thirst (about 8 glasses a day).   Getting plenty of rest.   Increasing calcium in your diet (1200 mg a day).   Avoiding foods that you notice affect the baby in a bad way.  SEEK MEDICAL CARE IF:   You have any questions or difficulty with breastfeeding.   You need help.   You have a hard, red, sore area on your breast, accompanied by a fever of 100.5 F (38.1 C) or more.   Your baby is too sleepy to eat well or is having trouble sleeping.   Your baby is wetting less than 6 diapers per day, by 5 days of age.   Your baby's skin or white part of his or her eyes is more yellow than it was in the hospital.   You feel depressed.  Document Released: 11/21/2005 Document Revised: 11/10/2011 Document Reviewed: 07/06/2009 Exit[employer] Patient Information 2012 [employer]. 

## 2012-04-23 NOTE — Progress Notes (Signed)
Post discharge chart review completed.  

## 2012-04-24 ENCOUNTER — Inpatient Hospital Stay (HOSPITAL_COMMUNITY): Admission: RE | Admit: 2012-04-24 | Payer: Medicaid Other | Source: Ambulatory Visit

## 2012-04-24 ENCOUNTER — Encounter: Payer: Medicaid Other | Admitting: Obstetrics and Gynecology

## 2012-04-26 ENCOUNTER — Ambulatory Visit (HOSPITAL_COMMUNITY): Payer: Medicaid Other

## 2013-01-02 ENCOUNTER — Encounter (HOSPITAL_COMMUNITY): Payer: Self-pay | Admitting: *Deleted

## 2013-01-02 ENCOUNTER — Emergency Department (HOSPITAL_COMMUNITY): Payer: No Typology Code available for payment source

## 2013-01-02 ENCOUNTER — Emergency Department (HOSPITAL_COMMUNITY)
Admission: EM | Admit: 2013-01-02 | Discharge: 2013-01-02 | Disposition: A | Discharge status: Home / Self Care | Payer: No Typology Code available for payment source | Attending: Emergency Medicine | Admitting: Emergency Medicine

## 2013-01-02 DIAGNOSIS — Z8619 Personal history of other infectious and parasitic diseases: Secondary | ICD-10-CM | POA: Insufficient documentation

## 2013-01-02 DIAGNOSIS — S298XXA Other specified injuries of thorax, initial encounter: Secondary | ICD-10-CM | POA: Insufficient documentation

## 2013-01-02 DIAGNOSIS — T148XXA Other injury of unspecified body region, initial encounter: Secondary | ICD-10-CM

## 2013-01-02 DIAGNOSIS — T07XXXA Unspecified multiple injuries, initial encounter: Secondary | ICD-10-CM | POA: Insufficient documentation

## 2013-01-02 DIAGNOSIS — IMO0002 Reserved for concepts with insufficient information to code with codable children: Secondary | ICD-10-CM | POA: Insufficient documentation

## 2013-01-02 DIAGNOSIS — Y92410 Unspecified street and highway as the place of occurrence of the external cause: Secondary | ICD-10-CM | POA: Insufficient documentation

## 2013-01-02 DIAGNOSIS — Z87448 Personal history of other diseases of urinary system: Secondary | ICD-10-CM | POA: Insufficient documentation

## 2013-01-02 DIAGNOSIS — Y9389 Activity, other specified: Secondary | ICD-10-CM | POA: Insufficient documentation

## 2013-01-02 MED ORDER — CYCLOBENZAPRINE HCL 5 MG PO TABS: 5.0000 mg | ORAL_TABLET | Freq: Three times a day (TID) | ORAL | Status: DC | PRN

## 2013-01-02 MED ORDER — NAPROXEN 500 MG PO TABS: 500.0000 mg | ORAL_TABLET | Freq: Two times a day (BID) | ORAL | Status: DC
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
## 2013-05-31 ENCOUNTER — Encounter: Payer: Self-pay | Admitting: Medical

## 2013-05-31 ENCOUNTER — Ambulatory Visit (INDEPENDENT_AMBULATORY_CARE_PROVIDER_SITE_OTHER): Payer: Medicaid Other | Admitting: Medical

## 2013-05-31 VITALS — BP 110/80 | HR 76 | Temp 97.8°F | Resp 16 | Wt 191.0 lb

## 2013-05-31 DIAGNOSIS — M25561 Pain in right knee: Secondary | ICD-10-CM

## 2013-05-31 DIAGNOSIS — M25569 Pain in unspecified knee: Secondary | ICD-10-CM

## 2013-05-31 MED ORDER — IBUPROFEN 600 MG PO TABS: 600.0000 mg | ORAL_TABLET | Freq: Three times a day (TID) | ORAL | Status: DC | PRN

## 2013-05-31 NOTE — Patient Instructions (Signed)
Knee pain/inflammation  Recommendations:  Short term: 1) rest the knee, no deep bending, no running, no squatting for the next 5-7 days 2) ice ice ice - ice for 20 minutes 2-3 times daily 3) begin ibuprofen 3 times daily for the next 3-5 days, then just use as needed  After the next 5-7 days, gradually begin incorporating squatting and running.  Try having a training/running regimen that you follow the next 2-3 months.  Use the handout on 5k training plan.

## 2013-05-31 NOTE — Progress Notes (Signed)
Subjective Here for knee pain and swelling, right.  About a month ago started exercising.   Signed up for a boot camp.  right knee started hurting a week ago.  Every time she squats gets bubbling sound/popping in both knees.  After going long distance, gets pains in right knee, right knee swells.  Left knee doesn't hurt, but pops and makes the same bubbling sound.  No hip or ankle pain.  Using Advil about 2 times per week.  Denies hx/o trauma or injury or surgery to knee.   No prior surgery ever.  Objective: Filed Vitals:   05/31/13 1401  BP: 110/80  Pulse: 76  Temp: 97.8 F (36.6 C)  Resp: 16    General appearance: alert, no distress, WD/WN, overweight Hispanic female MSK: mild pain with extension, but otherwise no swelling, no deformity,nontender to palpation, no laxity, negative special tests LE neurovascularly intact   Assessment: Encounter Diagnosis  Name Primary?  . Knee pain, acute, right Yes    Plan: Right knee pain and swelling due to acute inflammation from overstraining.  prescription for ibuprofen 600mg  today, advised 5-7 day period of relative rest, ice, ibuprofen, and avoidance of running and squatting.   Gave her detailed 5k training plan that uses more gradual approach to increasing running and mileage. Discussed strategies to build on her exercise program gradually.  Follow-up prn

## 2013-12-05 NOTE — L&D Delivery Note (Signed)
Delivery Note At 5:16 PM a viable female was delivered via Vaginal, Spontaneous Delivery (Presentation: Occiput Anterior ).  APGAR: 9,9 ; weight pending  .   Placenta status: spontaneous.  Cord: 3 vessels with the following complications: None.  Cord pH: n/a  After delivery of the fetal head, a tight nuchal cord x 2 was noticed.  It was unable to be reduced.  The anterior right shoulder was unable to be delivered with the usual gentle retraction.  A shoulder dystocia was called and additional help was requested.  The patient was instructed to stop pushing.  McRoberts and suprapubic pressure applied.  The anterior shoulder then delivered without difficulty.  Total legnth of shoulder dystocia was 30 seconds.  The baby was placed skin to skin with mom.  After vigorous stimulation, was noted to be moving both upper extremities well and equally.     Anesthesia: Epidural  Episiotomy:  none Lacerations:  none Suture Repair: n/a Est. Blood Loss (mL):  200  Mom to postpartum.  Baby to Couplet care / Skin to Skin.  Marlow BaarsCLARK, Bracha Frankowski 11/12/2014, 5:28 PM

## 2014-04-01 DIAGNOSIS — J309 Allergic rhinitis, unspecified: Secondary | ICD-10-CM | POA: Insufficient documentation

## 2014-06-27 LAB — OB RESULTS CONSOLE RUBELLA ANTIBODY, IGM: Rubella: IMMUNE

## 2014-06-27 LAB — OB RESULTS CONSOLE RPR: RPR: NONREACTIVE

## 2014-06-27 LAB — OB RESULTS CONSOLE HEPATITIS B SURFACE ANTIGEN: Hepatitis B Surface Ag: NEGATIVE

## 2014-06-27 LAB — OB RESULTS CONSOLE HIV ANTIBODY (ROUTINE TESTING): HIV: NONREACTIVE

## 2014-10-06 ENCOUNTER — Encounter: Payer: Self-pay | Admitting: Medical

## 2014-11-12 ENCOUNTER — Inpatient Hospital Stay (HOSPITAL_COMMUNITY): Payer: Medicaid Other | Admitting: Anesthesiology

## 2014-11-12 ENCOUNTER — Encounter (HOSPITAL_COMMUNITY): Payer: Self-pay | Admitting: *Deleted

## 2014-11-12 ENCOUNTER — Inpatient Hospital Stay (HOSPITAL_COMMUNITY)
Admission: AD | Admit: 2014-11-12 | Discharge: 2014-11-14 | DRG: 775 | Disposition: A | Discharge status: Home / Self Care | Payer: Medicaid Other | Source: Ambulatory Visit | Attending: Obstetrics & Gynecology | Admitting: Obstetrics & Gynecology

## 2014-11-12 DIAGNOSIS — Z6838 Body mass index (BMI) 38.0-38.9, adult: Secondary | ICD-10-CM | POA: Diagnosis not present

## 2014-11-12 DIAGNOSIS — Z3A39 39 weeks gestation of pregnancy: Secondary | ICD-10-CM | POA: Diagnosis present

## 2014-11-12 DIAGNOSIS — O99214 Obesity complicating childbirth: Secondary | ICD-10-CM | POA: Diagnosis present

## 2014-11-12 LAB — RPR

## 2014-11-12 LAB — CBC
HCT: 37.6 % (ref 36.0–46.0)
Hemoglobin: 12.5 g/dL (ref 12.0–15.0)
MCH: 27.4 pg (ref 26.0–34.0)
MCHC: 33.2 g/dL (ref 30.0–36.0)
MCV: 82.5 fL (ref 78.0–100.0)
Platelets: 193 10*3/uL (ref 150–400)
RBC: 4.56 MIL/uL (ref 3.87–5.11)
RDW: 14.2 % (ref 11.5–15.5)
WBC: 11.9 10*3/uL — ABNORMAL HIGH (ref 4.0–10.5)

## 2014-11-12 LAB — ABO/RH: ABO/RH(D): A POS

## 2014-11-12 LAB — OB RESULTS CONSOLE HIV ANTIBODY (ROUTINE TESTING): HIV: NONREACTIVE

## 2014-11-12 LAB — OB RESULTS CONSOLE GC/CHLAMYDIA
Chlamydia: NEGATIVE
Gonorrhea: NEGATIVE

## 2014-11-12 LAB — OB RESULTS CONSOLE GBS: GBS: NEGATIVE

## 2014-11-12 LAB — OB RESULTS CONSOLE RPR: RPR: NONREACTIVE

## 2014-11-12 LAB — TYPE AND SCREEN
ABO/RH(D): A POS
Antibody Screen: NEGATIVE

## 2014-11-12 LAB — HIV ANTIBODY (ROUTINE TESTING W REFLEX): HIV 1&2 Ab, 4th Generation: NONREACTIVE

## 2014-11-12 MED ORDER — OXYTOCIN 40 UNITS IN LACTATED RINGERS INFUSION - SIMPLE MED: 62.5000 mL/h | INTRAVENOUS | Status: DC

## 2014-11-12 MED ORDER — LACTATED RINGERS IV SOLN: 500.0000 mL | INTRAVENOUS | Status: DC | PRN

## 2014-11-12 MED ORDER — TETANUS-DIPHTH-ACELL PERTUSSIS 5-2.5-18.5 LF-MCG/0.5 IM SUSP
0.5000 mL | Freq: Once | INTRAMUSCULAR | Status: AC
  Administered 2014-11-14: 0.5 mL via INTRAMUSCULAR
  Filled 2014-11-12: qty 0.5

## 2014-11-12 MED ORDER — ONDANSETRON HCL 4 MG/2ML IJ SOLN: 4.0000 mg | INTRAMUSCULAR | Status: DC | PRN

## 2014-11-12 MED ORDER — LACTATED RINGERS IV SOLN
INTRAVENOUS | Status: DC
  Administered 2014-11-12 (×2): via INTRAVENOUS

## 2014-11-12 MED ORDER — FENTANYL 2.5 MCG/ML BUPIVACAINE 1/10 % EPIDURAL INFUSION (WH - ANES)
14.0000 mL/h | INTRAMUSCULAR | Status: DC | PRN
  Administered 2014-11-12 (×2): 14 mL/h via EPIDURAL
  Filled 2014-11-12 (×2): qty 125

## 2014-11-12 MED ORDER — INFLUENZA VAC SPLIT QUAD 0.5 ML IM SUSY
0.5000 mL | PREFILLED_SYRINGE | INTRAMUSCULAR | Status: DC
  Filled 2014-11-12: qty 0.5

## 2014-11-12 MED ORDER — SIMETHICONE 80 MG PO CHEW: 80.0000 mg | CHEWABLE_TABLET | ORAL | Status: DC | PRN

## 2014-11-12 MED ORDER — PHENYLEPHRINE 40 MCG/ML (10ML) SYRINGE FOR IV PUSH (FOR BLOOD PRESSURE SUPPORT)
80.0000 ug | PREFILLED_SYRINGE | INTRAVENOUS | Status: DC | PRN
  Filled 2014-11-12: qty 2
  Filled 2014-11-12: qty 10

## 2014-11-12 MED ORDER — CITRIC ACID-SODIUM CITRATE 334-500 MG/5ML PO SOLN: 30.0000 mL | ORAL | Status: DC | PRN

## 2014-11-12 MED ORDER — PHENYLEPHRINE 40 MCG/ML (10ML) SYRINGE FOR IV PUSH (FOR BLOOD PRESSURE SUPPORT)
80.0000 ug | PREFILLED_SYRINGE | INTRAVENOUS | Status: DC | PRN
  Filled 2014-11-12: qty 2

## 2014-11-12 MED ORDER — LANOLIN HYDROUS EX OINT
TOPICAL_OINTMENT | CUTANEOUS | Status: DC | PRN
Start: 2014-11-12 — End: 2014-11-14

## 2014-11-12 MED ORDER — OXYCODONE-ACETAMINOPHEN 5-325 MG PO TABS: 2.0000 | ORAL_TABLET | ORAL | Status: DC | PRN

## 2014-11-12 MED ORDER — BENZOCAINE-MENTHOL 20-0.5 % EX AERO
1.0000 | INHALATION_SPRAY | CUTANEOUS | Status: DC | PRN
Start: 2014-11-12 — End: 2014-11-14

## 2014-11-12 MED ORDER — PRENATAL MULTIVITAMIN CH
1.0000 | ORAL_TABLET | Freq: Every day | ORAL | Status: DC
  Administered 2014-11-13 – 2014-11-14 (×2): 1 via ORAL
  Filled 2014-11-12 (×2): qty 1

## 2014-11-12 MED ORDER — OXYCODONE-ACETAMINOPHEN 5-325 MG PO TABS
1.0000 | ORAL_TABLET | ORAL | Status: DC | PRN
  Administered 2014-11-13: 1 via ORAL
  Filled 2014-11-12: qty 1

## 2014-11-12 MED ORDER — ACETAMINOPHEN 325 MG PO TABS: 650.0000 mg | ORAL_TABLET | ORAL | Status: DC | PRN

## 2014-11-12 MED ORDER — DIPHENHYDRAMINE HCL 50 MG/ML IJ SOLN: 12.5000 mg | INTRAMUSCULAR | Status: DC | PRN

## 2014-11-12 MED ORDER — SENNOSIDES-DOCUSATE SODIUM 8.6-50 MG PO TABS
2.0000 | ORAL_TABLET | ORAL | Status: DC
  Administered 2014-11-13 (×2): 2 via ORAL
  Filled 2014-11-12 (×2): qty 2

## 2014-11-12 MED ORDER — LIDOCAINE HCL (PF) 1 % IJ SOLN
INTRAMUSCULAR | Status: DC | PRN
  Administered 2014-11-12 (×2): 9 mL

## 2014-11-12 MED ORDER — IBUPROFEN 600 MG PO TABS
600.0000 mg | ORAL_TABLET | Freq: Four times a day (QID) | ORAL | Status: DC
  Administered 2014-11-13 – 2014-11-14 (×7): 600 mg via ORAL
  Filled 2014-11-12 (×7): qty 1

## 2014-11-12 MED ORDER — WITCH HAZEL-GLYCERIN EX PADS: 1.0000 "application " | MEDICATED_PAD | CUTANEOUS | Status: DC | PRN

## 2014-11-12 MED ORDER — ONDANSETRON HCL 4 MG/2ML IJ SOLN: 4.0000 mg | Freq: Four times a day (QID) | INTRAMUSCULAR | Status: DC | PRN

## 2014-11-12 MED ORDER — DIBUCAINE 1 % RE OINT: 1.0000 "application " | TOPICAL_OINTMENT | RECTAL | Status: DC | PRN

## 2014-11-12 MED ORDER — OXYTOCIN BOLUS FROM INFUSION: 500.0000 mL | INTRAVENOUS | Status: DC

## 2014-11-12 MED ORDER — FENTANYL 2.5 MCG/ML BUPIVACAINE 1/10 % EPIDURAL INFUSION (WH - ANES)
INTRAMUSCULAR | Status: DC | PRN
  Administered 2014-11-12: 14 mL/h via EPIDURAL

## 2014-11-12 MED ORDER — EPHEDRINE 5 MG/ML INJ
10.0000 mg | INTRAVENOUS | Status: DC | PRN
  Filled 2014-11-12: qty 2

## 2014-11-12 MED ORDER — FLEET ENEMA 7-19 GM/118ML RE ENEM: 1.0000 | ENEMA | RECTAL | Status: DC | PRN

## 2014-11-12 MED ORDER — TERBUTALINE SULFATE 1 MG/ML IJ SOLN: 0.2500 mg | Freq: Once | INTRAMUSCULAR | Status: DC | PRN

## 2014-11-12 MED ORDER — LIDOCAINE HCL (PF) 1 % IJ SOLN
30.0000 mL | INTRAMUSCULAR | Status: DC | PRN
  Filled 2014-11-12: qty 30

## 2014-11-12 MED ORDER — OXYCODONE-ACETAMINOPHEN 5-325 MG PO TABS: 1.0000 | ORAL_TABLET | ORAL | Status: DC | PRN

## 2014-11-12 MED ORDER — OXYTOCIN 40 UNITS IN LACTATED RINGERS INFUSION - SIMPLE MED
1.0000 m[IU]/min | INTRAVENOUS | Status: DC
  Administered 2014-11-12: 666 m[IU]/min via INTRAVENOUS
  Administered 2014-11-12: 2 m[IU]/min via INTRAVENOUS
  Filled 2014-11-12: qty 1000

## 2014-11-12 MED ORDER — LACTATED RINGERS IV SOLN: 500.0000 mL | Freq: Once | INTRAVENOUS | Status: DC

## 2014-11-12 MED ORDER — DIPHENHYDRAMINE HCL 25 MG PO CAPS: 25.0000 mg | ORAL_CAPSULE | Freq: Four times a day (QID) | ORAL | Status: DC | PRN

## 2014-11-12 MED ORDER — ONDANSETRON HCL 4 MG PO TABS: 4.0000 mg | ORAL_TABLET | ORAL | Status: DC | PRN

## 2014-11-12 NOTE — H&P (Signed)
28 y.o. [id number] @ 1131w4d presents with c/o contractions.  Otherwise has good fetal movement and no bleeding. Is now comfortable w epidural  Past Medical History  Diagnosis Date  . H/O varicella   . Medical history non-contributory     Past Surgical History  Procedure Laterality Date  . No past surgeries      OB History  Gravida Para Term Preterm AB SAB TAB Ectopic Multiple Living  3 2 2  0 0 0 0 0 0 2    # Outcome Date GA Lbr Len/2nd Weight Sex Delivery Anes PTL Lv  3 Current           2 Term 04/21/12 3924w0d 15:30 / 00:08 3.374 kg (7 lb 7 oz) M Vag-Spont EPI  Y  1 Term 09/2010 4411w0d 09:00 3.317 kg (7 lb 5 oz) F Vag-Spont EPI N Y      History   Social History  . Marital Status: Single    Spouse Name: N/A    Number of Children: N/A  . Years of Education: N/A   Occupational History  . Not on file.   Social History Main Topics  . Smoking status: Never Smoker   . Smokeless tobacco: Not on file  . Alcohol Use: No  . Drug Use: No  . Sexual Activity: Yes    Birth Control/ Protection: None     Comment: currently pregnant   Other Topics Concern  . Not on file   Social History Narrative   Review of patient's allergies indicates no known allergies.    Prenatal Transfer Tool  Maternal Diabetes: No Genetic Screening: Declined Maternal Ultrasounds/Referrals: Normal Fetal Ultrasounds or other Referrals:  None Maternal Substance Abuse:  No Significant Maternal Medications:  None Significant Maternal Lab Results: Lab values include: Group B Strep negative   Other PNC: uncomplicated.    Filed Vitals:   11/12/14 0746  BP: 111/68  Pulse: 87  Temp: 98.2 F (36.8 C)  Resp: 18     General:  NAD Lungs: CTAB Cardiac: RRR Abdomen:  soft, gravid, EFW 7-7.5# Ex:  no edema SVE:  5/80/-1, AROM, clear fluid FHTs:  130s, mod var, + accels, no decels, Cat 1 Toco:  q73min   A/P   28 y.o. 7331w4d  [id number] presents with labor Pitocin as indicated for labor augmentation FSR/  vtx/ GBS neg/PP 7.5#  Clarks GreenLARK, Carmelina DaneYANNA

## 2014-11-12 NOTE — Anesthesia Procedure Notes (Signed)
Epidural Patient location during procedure: OB Start time: 11/12/2014 8:07 AM End time: 11/12/2014 8:11 AM  Staffing Anesthesiologist: Leilani AbleHATCHETT, Unknown Schleyer Performed by: anesthesiologist   Preanesthetic Checklist Completed: patient identified, surgical consent, pre-op evaluation, timeout performed, IV checked, risks and benefits discussed and monitors and equipment checked  Epidural Patient position: sitting Prep: site prepped and draped and DuraPrep Patient monitoring: continuous pulse ox and blood pressure Approach: midline Location: L3-L4 Injection technique: LOR air  Needle:  Needle type: Tuohy  Needle gauge: 17 G Needle length: 9 cm and 9 Needle insertion depth: 7 cm Catheter type: closed end flexible Catheter size: 19 Gauge Catheter at skin depth: 12 cm Test dose: negative and Other  Assessment Sensory level: T9 Events: blood not aspirated, injection not painful, no injection resistance, negative IV test and no paresthesia  Additional Notes Reason for block:procedure for pain

## 2014-11-12 NOTE — Anesthesia Preprocedure Evaluation (Signed)
Anesthesia Evaluation  Patient identified by MRN, date of birth, ID band Patient awake    Reviewed: Allergy & Precautions, H&P , NPO status , Patient's Chart, lab work & pertinent test results  Airway Mallampati: II  TM Distance: >3 FB Neck ROM: full    Dental no notable dental hx.    Pulmonary neg pulmonary ROS,    Pulmonary exam normal       Cardiovascular negative cardio ROS      Neuro/Psych negative neurological ROS  negative psych ROS   GI/Hepatic negative GI ROS, Neg liver ROS,   Endo/Other  Morbid obesity  Renal/GU negative Renal ROS     Musculoskeletal   Abdominal (+) + obese,   Peds  Hematology negative hematology ROS (+)   Anesthesia Other Findings   Reproductive/Obstetrics (+) Pregnancy                             Anesthesia Physical Anesthesia Plan  ASA: III  Anesthesia Plan: Epidural   Post-op Pain Management:    Induction:   Airway Management Planned:   Additional Equipment:   Intra-op Plan:   Post-operative Plan:   Informed Consent: I have reviewed the patients History and Physical, chart, labs and discussed the procedure including the risks, benefits and alternatives for the proposed anesthesia with the patient or authorized representative who has indicated his/her understanding and acceptance.     Plan Discussed with:   Anesthesia Plan Comments:         Anesthesia Quick Evaluation  

## 2014-11-12 NOTE — Progress Notes (Signed)
Comfortable w epidural  SVE: 8/90/0 EFM: 140s, mod var, + scalp stim w SVE, cat 1 Toco: q703min  IUPC placed, ctx not adequate  G3P2 @ 1514w4d w labor Protracted first stage.  IUPC placed and ctx not adequate.  Inc pitocin FSR Anticipate SVD

## 2014-11-12 NOTE — Plan of Care (Signed)
Problem: Consults Goal: Skin Care Protocol Initiated - if Braden Score 18 or less If consults are not indicated, leave blank or document N/A Outcome: Not Applicable Date Met:  88/33/74 Goal: Nutrition Consult-if indicated Outcome: Not Applicable Date Met:  45/14/60 Goal: Diabetes Guidelines if Diabetic/Glucose > 140 If diabetic or lab glucose is > 140 mg/dl - Initiate Diabetes/Hyperglycemia Guidelines & Document Interventions  Outcome: Not Applicable Date Met:  47/99/87  Problem: Phase I Progression Outcomes Goal: Voiding adequately Outcome: Completed/Met Date Met:  11/12/14 Goal: Foley catheter patent Outcome: Not Applicable Date Met:  21/58/72 Goal: IS, TCDB as ordered Outcome: Completed/Met Date Met:  11/12/14

## 2014-11-13 LAB — CBC
HCT: 33.8 % — ABNORMAL LOW (ref 36.0–46.0)
Hemoglobin: 11.2 g/dL — ABNORMAL LOW (ref 12.0–15.0)
MCH: 27.5 pg (ref 26.0–34.0)
MCHC: 33.1 g/dL (ref 30.0–36.0)
MCV: 82.8 fL (ref 78.0–100.0)
Platelets: 175 10*3/uL (ref 150–400)
RBC: 4.08 MIL/uL (ref 3.87–5.11)
RDW: 14.4 % (ref 11.5–15.5)
WBC: 13.2 10*3/uL — ABNORMAL HIGH (ref 4.0–10.5)

## 2014-11-13 NOTE — Plan of Care (Signed)
Problem: Phase I Progression Outcomes Goal: VS, stable, temp < 100.4 degrees F Outcome: Completed/Met Date Met:  11/13/14

## 2014-11-13 NOTE — Plan of Care (Signed)
Problem: Phase II Progression Outcomes Goal: Afebrile, VS remain stable Outcome: Completed/Met Date Met:  11/13/14

## 2014-11-13 NOTE — Anesthesia Postprocedure Evaluation (Signed)
Anesthesia Post Note  Patient: Carol May  Procedure(s) Performed: * No procedures listed *  Anesthesia type: Epidural  Patient location: Mother/Baby  Post pain: Pain level controlled  Post assessment: Post-op Vital signs reviewed  Last Vitals:  Filed Vitals:   11/13/14 0649  BP: 105/66  Pulse: 73  Temp: 36.8 C  Resp: 18    Post vital signs: Reviewed  Level of consciousness:alert  Complications: No apparent anesthesia complications

## 2014-11-13 NOTE — Progress Notes (Signed)
Parents do not desire circ. 

## 2014-11-13 NOTE — Plan of Care (Signed)
Problem: Phase I Progression Outcomes Goal: Pain controlled with appropriate interventions Outcome: Completed/Met Date Met:  01/08/14

## 2014-11-13 NOTE — Plan of Care (Signed)
Problem: Phase I Progression Outcomes Goal: Other Phase I Outcomes/Goals Outcome: Completed/Met Date Met:  11/13/14

## 2014-11-13 NOTE — Plan of Care (Signed)
Problem: Phase I Progression Outcomes Goal: Initial discharge plan identified Outcome: Completed/Met Date Met:  11/13/14

## 2014-11-13 NOTE — Plan of Care (Signed)
Problem: Phase II Progression Outcomes Goal: Tolerating diet Outcome: Completed/Met Date Met:  11/13/14     

## 2014-11-13 NOTE — Plan of Care (Signed)
Problem: Phase I Progression Outcomes Goal: OOB as tolerated unless otherwise ordered Outcome: Completed/Met Date Met:  11/13/14     

## 2014-11-13 NOTE — Plan of Care (Signed)
Problem: Phase II Progression Outcomes Goal: Incision intact & without signs/symptoms of infection Outcome: Not Applicable Date Met:  73/74/96 Goal: Rh isoimmunization per orders Outcome: Not Applicable Date Met:  64/66/05

## 2014-11-13 NOTE — Plan of Care (Signed)
Problem: Phase II Progression Outcomes Goal: Pain controlled on oral analgesia Outcome: Completed/Met Date Met:  11/13/14

## 2014-11-13 NOTE — Plan of Care (Signed)
Problem: Phase II Progression Outcomes Goal: Progress activity as tolerated unless otherwise ordered Outcome: Completed/Met Date Met:  11/13/14     

## 2014-11-13 NOTE — Discharge Summary (Signed)
Obstetric Discharge Summary Reason for Admission: onset of labor Prenatal Procedures: none Intrapartum Procedures: spontaneous vaginal delivery Postpartum Procedures: none Complications-Operative and Postpartum: none HEMOGLOBIN  Date Value Ref Range Status  11/13/2014 11.2* 12.0 - 15.0 g/dL Final   HCT  Date Value Ref Range Status  11/13/2014 33.8* 36.0 - 46.0 % Final    Discharge Diagnoses: Term Pregnancy-delivered  Discharge Information: Date: 11/13/2014 Activity: pelvic rest Diet: routine Medications: Ibuprofen Condition: stable Instructions: refer to practice specific booklet Discharge to: home Follow-up Information    Follow up with Marlow BaarsLARK, DYANNA, MD In 4 weeks.   Specialty:  Obstetrics   Contact information:   [address] [place] [id number] [phone]       Newborn Data: Live born female  Birth Weight: 8 lb 6.8 oz (3822 g) APGAR: 8, 9  Home with mother.  Darion Milewski A 11/13/2014, 7:46 AM

## 2014-11-14 NOTE — Progress Notes (Signed)
Pt was seen by myself on 12-10 in am and all was normal.  Below is note written today but was for yesterday:  Patient is eating, ambulating, voiding.  Pain control is good.  VSS  Fundus firm Perineum without swelling.  Lab Results  Component Value Date   WBC 13.2* 11/13/2014   HGB 11.2* 11/13/2014   HCT 33.8* 11/13/2014   MCV 82.8 11/13/2014   PLT 175 11/13/2014    --/--/A POS, A POS (12/09 0700)/RI  A/P Post partum day 1.  Routine care.  Expect d/c routine.    Carol May A

## 2014-11-14 NOTE — Progress Notes (Signed)
UR chart review completed.  

## 2014-11-14 NOTE — Progress Notes (Signed)
Patient is eating, ambulating, voiding.  Pain control is good.  Filed Vitals:   11/13/14 0120 11/13/14 0649 11/13/14 1714 11/14/14 0449  BP: 97/51 105/66 105/65 105/61  Pulse: 72 73 77 76  Temp: 98.7 F (37.1 C) 98.2 F (36.8 C) 98.7 F (37.1 C) 98.4 F (36.9 C)  TempSrc:  Oral Oral Oral  Resp:  18 18   Height:      Weight:      SpO2:    100%    Fundus firm Perineum without swelling.  Lab Results  Component Value Date   WBC 13.2* 11/13/2014   HGB 11.2* 11/13/2014   HCT 33.8* 11/13/2014   MCV 82.8 11/13/2014   PLT 175 11/13/2014    --/--/A POS, A POS (12/09 0700)/RI  A/P Post partum day 2.  Routine care.  Expect d/c today.    Carol May A

## 2014-12-05 LAB — HM PAP SMEAR

## 2016-03-11 ENCOUNTER — Emergency Department (HOSPITAL_COMMUNITY)
Admission: EM | Admit: 2016-03-11 | Discharge: 2016-03-11 | Disposition: A | Discharge status: Home / Self Care | Payer: Medicaid Other | Attending: Emergency Medicine | Admitting: Emergency Medicine

## 2016-03-11 ENCOUNTER — Encounter (HOSPITAL_COMMUNITY): Payer: Self-pay | Admitting: *Deleted

## 2016-03-11 DIAGNOSIS — Z79899 Other long term (current) drug therapy: Secondary | ICD-10-CM | POA: Diagnosis not present

## 2016-03-11 DIAGNOSIS — J029 Acute pharyngitis, unspecified: Secondary | ICD-10-CM | POA: Diagnosis present

## 2016-03-11 DIAGNOSIS — J309 Allergic rhinitis, unspecified: Secondary | ICD-10-CM | POA: Insufficient documentation

## 2016-03-11 DIAGNOSIS — Z8619 Personal history of other infectious and parasitic diseases: Secondary | ICD-10-CM | POA: Diagnosis not present

## 2016-03-11 MED ORDER — NAPROXEN 250 MG PO TABS: 250.0000 mg | ORAL_TABLET | Freq: Two times a day (BID) | ORAL | Status: DC

## 2016-03-11 MED ORDER — FLUTICASONE PROPIONATE 50 MCG/ACT NA SUSP: 2.0000 | Freq: Every day | NASAL | Status: DC

## 2016-03-11 MED ORDER — CETIRIZINE HCL 10 MG PO TABS: 10.0000 mg | ORAL_TABLET | Freq: Every day | ORAL | Status: DC

## 2016-03-11 MED ORDER — ACETAMINOPHEN 325 MG PO TABS
650.0000 mg | ORAL_TABLET | Freq: Once | ORAL | Status: AC
  Administered 2016-03-11: 650 mg via ORAL
  Filled 2016-03-11: qty 2

## 2016-03-11 NOTE — Discharge Instructions (Signed)
Allergic Rhinitis Allergic rhinitis is when the mucous membranes in the nose respond to allergens. Allergens are particles in the air that cause your body to have an allergic reaction. This causes you to release allergic antibodies. Through a chain of events, these eventually cause you to release histamine into the blood stream. Although meant to protect the body, it is this release of histamine that causes your discomfort, such as frequent sneezing, congestion, and an itchy, runny nose.  CAUSES Seasonal allergic rhinitis (hay fever) is caused by pollen allergens that may come from grasses, trees, and weeds. Year-round allergic rhinitis (perennial allergic rhinitis) is caused by allergens such as house dust mites, pet dander, and mold spores. SYMPTOMS  Nasal stuffiness (congestion).  Itchy, runny nose with sneezing and tearing of the eyes. DIAGNOSIS Your health care provider can help you determine the allergen or allergens that trigger your symptoms. If you and your health care provider are unable to determine the allergen, skin or blood testing may be used. Your health care provider will diagnose your condition after taking your health history and performing a physical exam. Your health care provider may assess you for other related conditions, such as asthma, pink eye, or an ear infection. TREATMENT Allergic rhinitis does not have a cure, but it can be controlled by:  Medicines that block allergy symptoms. These may include allergy shots, nasal sprays, and oral antihistamines.  Avoiding the allergen. Hay fever may often be treated with antihistamines in pill or nasal spray forms. Antihistamines block the effects of histamine. There are over-the-counter medicines that may help with nasal congestion and swelling around the eyes. Check with your health care provider before taking or giving this medicine. If avoiding the allergen or the medicine prescribed do not work, there are many new medicines  your health care provider can prescribe. Stronger medicine may be used if initial measures are ineffective. Desensitizing injections can be used if medicine and avoidance does not work. Desensitization is when a patient is given ongoing shots until the body becomes less sensitive to the allergen. Make sure you follow up with your health care provider if problems continue. HOME CARE INSTRUCTIONS It is not possible to completely avoid allergens, but you can reduce your symptoms by taking steps to limit your exposure to them. It helps to know exactly what you are allergic to so that you can avoid your specific triggers. SEEK MEDICAL CARE IF:  You have a fever.  You develop a cough that does not stop easily (persistent).  You have shortness of breath.  You start wheezing.  Symptoms interfere with normal daily activities.   This information is not intended to replace advice given to you by your health care provider. Make sure you discuss any questions you have with your health care provider.   Document Released: 08/16/2001 Document Revised: 12/12/2014 Document Reviewed: 07/29/2013 Elsevier Interactive Patient Education 2016 [employer] Sore Throat A sore throat is pain, burning, irritation, or scratchiness of the throat. There is often pain or tenderness when swallowing or talking. A sore throat may be accompanied by other symptoms, such as coughing, sneezing, fever, and swollen neck glands. A sore throat is often the first sign of another sickness, such as a cold, flu, strep throat, or mononucleosis (commonly known as mono). Most sore throats go away without medical treatment. CAUSES  The most common causes of a sore throat include:  A viral infection, such as a cold, flu, or mono.  A bacterial infection, such as strep  throat, tonsillitis, or whooping cough.  Seasonal allergies.  Dryness in the air.  Irritants, such as smoke or pollution.  Gastroesophageal reflux disease  (GERD). HOME CARE INSTRUCTIONS   Only take over-the-counter medicines as directed by your caregiver.  Drink enough fluids to keep your urine clear or pale yellow.  Rest as needed.  Try using throat sprays, lozenges, or sucking on hard candy to ease any pain (if older than 4 years or as directed).  Sip warm liquids, such as broth, herbal tea, or warm water with honey to relieve pain temporarily. You may also eat or drink cold or frozen liquids such as frozen ice pops.  Gargle with salt water (mix 1 tsp salt with 8 oz of water).  Do not smoke and avoid secondhand smoke.  Put a cool-mist humidifier in your bedroom at night to moisten the air. You can also turn on a hot shower and sit in the bathroom with the door closed for 5-10 minutes. SEEK IMMEDIATE MEDICAL CARE IF:  You have difficulty breathing.  You are unable to swallow fluids, soft foods, or your saliva.  You have increased swelling in the throat.  Your sore throat does not get better in 7 days.  You have nausea and vomiting.  You have a fever or persistent symptoms for more than 2-3 days.  You have a fever and your symptoms suddenly get worse. MAKE SURE YOU:   Understand these instructions.  Will watch your condition.  Will get help right away if you are not doing well or get worse.   This information is not intended to replace advice given to you by your health care provider. Make sure you discuss any questions you have with your health care provider.   Document Released: 12/29/2004 Document Revised: 12/12/2014 Document Reviewed: 07/29/2012 Elsevier Interactive Patient Education [employer]

## 2016-03-11 NOTE — ED Provider Notes (Signed)
CSN: [id number]     Arrival date & time 03/11/16  2128 History  By signing my name below, I, Evon Slack, attest that this documentation has been prepared under the direction and in the presence of Everlene Farrier, PA-C. Electronically Signed: Evon Slack, ED Scribe. 03/11/2016. 10:07 PM.     Chief Complaint  Patient presents with  . Sore Throat   Patient is a 30 y.o. female presenting with pharyngitis. The history is provided by the patient. No language interpreter was used.  Sore Throat Pertinent negatives include no shortness of breath.   HPI Comments: Carol May is a 30 y.o. female who presents to the Emergency Department complaining of sore throat onset 2 weeks. Pt reports associated sneezing, rhinorrhea, cough, postnasal drip. Pt states that the the pain is worse when swallowing. Pt doesn't report any medications PTA. Pt denies fever, chills, nausea, vomiting, diarrhea, trouble swallowing or drooling.   Past Medical History  Diagnosis Date  . H/O varicella   . Medical history non-contributory    Past Surgical History  Procedure Laterality Date  . No past surgeries     No family history on file. Social History  Substance Use Topics  . Smoking status: Never Smoker   . Smokeless tobacco: None  . Alcohol Use: No   OB History    Gravida Para Term Preterm AB TAB SAB Ectopic Multiple Living   0 0 0 0 0 0 3     Review of Systems  Constitutional: Negative for fever and chills.  HENT: Positive for postnasal drip, rhinorrhea, sneezing and sore throat. Negative for congestion, drooling and trouble swallowing.   Eyes: Negative for visual disturbance.  Respiratory: Positive for cough. Negative for shortness of breath and wheezing.   Gastrointestinal: Negative for nausea, vomiting and diarrhea.  Musculoskeletal: Negative for neck pain and neck stiffness.  Skin: Negative for rash.     Allergies  Review of patient's allergies indicates no known  allergies.  Home Medications   Prior to Admission medications   Medication Sig Start Date End Date Taking? Authorizing Provider  cetirizine (ZYRTEC ALLERGY) 10 MG tablet Take 1 tablet (10 mg total) by mouth daily. 03/11/16   Everlene Farrier, PA-C  cyclobenzaprine (FLEXERIL) 5 MG tablet Take 1 tablet (5 mg total) by mouth 3 (three) times daily as needed for muscle spasms. Patient not taking: Reported on 11/12/2014 01/02/13   Linwood Dibbles, MD  fluticasone [facility]) 50 MCG/ACT nasal spray Place 2 sprays into both nostrils daily. 03/11/16   Everlene Farrier, PA-C  ibuprofen (ADVIL,MOTRIN) 600 MG tablet Take 1 tablet (600 mg total) by mouth every 8 (eight) hours as needed for pain. Patient not taking: Reported on 11/12/2014 05/31/13   Kermit Balo Tysinger, PA-C  naproxen (NAPROSYN) 250 MG tablet Take 1 tablet (250 mg total) by mouth 2 (two) times daily with a meal. 03/11/16   Everlene Farrier, PA-C  Prenatal Vit-Fe Fumarate-FA (MULTIVITAMIN-PRENATAL) 27-0.8 MG TABS tablet Take 1 tablet by mouth daily at 12 noon.    Historical Provider, MD   BP 109/77 mmHg  Pulse 84  Temp(Src) 98.5 F (36.9 C) (Oral)  Resp 16  SpO2 98%   Physical Exam  Constitutional: She appears well-developed and well-nourished. No distress.  HENT:  Head: Normocephalic and atraumatic.  Right Ear: Tympanic membrane and external ear normal.  Left Ear: Tympanic membrane and external ear normal.  Mouth/Throat: Uvula is midline and oropharynx is clear and moist. No trismus in the jaw. No uvula  swelling. No oropharyngeal exudate, posterior oropharyngeal edema, posterior oropharyngeal erythema or tonsillar abscesses.  No tonsillar hypertrophy or exudate. Uvula is midline without edema. No peritonsillar abscess. No trismus. No drooling. Mild middle ear effusion noted bilaterally. No TM erythema or loss of landmarks. Boggy nasal turbinates bilaterally.  Eyes: Conjunctivae are normal. Pupils are equal, round, and reactive to light. Right eye exhibits no  discharge. Left eye exhibits no discharge.  Neck: Normal range of motion. Neck supple. No JVD present. No tracheal deviation present.  Cardiovascular: Normal rate, regular rhythm, normal heart sounds and intact distal pulses.   Pulmonary/Chest: Effort normal and breath sounds normal. No stridor. No respiratory distress. She has no wheezes. She has no rales.  Lungs clear to auscultation bilaterally.  Abdominal: Soft. There is no tenderness.  Lymphadenopathy:    She has no cervical adenopathy.  Neurological: She is alert. Coordination normal.  Skin: Skin is warm and dry. No rash noted. She is not diaphoretic. No erythema. No pallor.  Psychiatric: She has a normal mood and affect. Her behavior is normal.  Nursing note and vitals reviewed.   ED Course  Procedures (including critical care time) DIAGNOSTIC STUDIES: Oxygen Saturation is 98% on RA, normal by my interpretation.    COORDINATION OF CARE: 10:05 PM-Discussed treatment plan with pt at bedside and pt agreed to plan.     Labs Review Labs Reviewed - No data to display  Imaging Review No results found.    EKG Interpretation None      Filed Vitals:   03/11/16 2140  BP: 109/77  Pulse: 84  Temp: 98.5 F (36.9 C)  TempSrc: Oral  Resp: 16  SpO2: 98%     MDM   Meds given in ED:  Medications  acetaminophen (TYLENOL) tablet 650 mg (650 mg Oral Given 03/11/16 2208)    New Prescriptions   CETIRIZINE (ZYRTEC ALLERGY) 10 MG TABLET    Take 1 tablet (10 mg total) by mouth daily.   FLUTICASONE (FLONASE) 50 MCG/ACT NASAL SPRAY    Place 2 sprays into both nostrils daily.   NAPROXEN (NAPROSYN) 250 MG TABLET    Take 1 tablet (250 mg total) by mouth 2 (two) times daily with a meal.    Final diagnoses:  Sore throat  Allergic rhinitis, unspecified allergic rhinitis type   This is a 30 y.o. female who presents to the Emergency Department complaining of sore throat onset 2 weeks. Pt reports associated sneezing, rhinorrhea, cough,  postnasal drip. Pt states that the the pain is worse when swallowing.  On exam the patient is afebrile nontoxic appearing. No tonsillar hypertrophy or exudates. No cervical lymphadenopathy. No trismus. No drooling. No peritonsillar abscess. Lungs are clear to auscultation bilaterally. No need for rapid strep based on CENTOR criteria. Patient seems to have sore throat from her postnasal drip and allergic rhinitis. We'll discharge with prescriptions for Zyrtec, Flonase and naproxen. I encouraged close follow-up by primary care and advised to follow-up with ENT if her symptoms persist. I advised the patient to follow-up with their primary care provider this week. I advised the patient to return to the emergency department with new or worsening symptoms or new concerns. The patient verbalized understanding and agreement with plan.    I personally performed the services described in this documentation, which was scribed in my presence. The recorded information has been reviewed and is accurate.           Everlene FarrierWilliam Nylene Inlow, PA-C 03/11/16 2320  Nelva Nayobert Beaton, MD 03/19/16 [phone]

## 2016-03-11 NOTE — ED Notes (Signed)
Sore throat for the past 2 weeks no temp

## 2016-07-24 ENCOUNTER — Emergency Department (HOSPITAL_COMMUNITY)
Admission: EM | Admit: 2016-07-24 | Discharge: 2016-07-24 | Disposition: A | Discharge status: Home / Self Care | Payer: Medicaid Other | Attending: Emergency Medicine | Admitting: Emergency Medicine

## 2016-07-24 ENCOUNTER — Encounter (HOSPITAL_COMMUNITY): Payer: Self-pay | Admitting: Emergency Medicine

## 2016-07-24 ENCOUNTER — Emergency Department (HOSPITAL_COMMUNITY): Payer: Medicaid Other

## 2016-07-24 DIAGNOSIS — S82851A Displaced trimalleolar fracture of right lower leg, initial encounter for closed fracture: Secondary | ICD-10-CM | POA: Diagnosis not present

## 2016-07-24 DIAGNOSIS — Y9351 Activity, roller skating (inline) and skateboarding: Secondary | ICD-10-CM | POA: Insufficient documentation

## 2016-07-24 DIAGNOSIS — Z791 Long term (current) use of non-steroidal anti-inflammatories (NSAID): Secondary | ICD-10-CM | POA: Insufficient documentation

## 2016-07-24 DIAGNOSIS — Y92331 Roller skating rink as the place of occurrence of the external cause: Secondary | ICD-10-CM | POA: Insufficient documentation

## 2016-07-24 DIAGNOSIS — Y999 Unspecified external cause status: Secondary | ICD-10-CM | POA: Insufficient documentation

## 2016-07-24 DIAGNOSIS — S99911A Unspecified injury of right ankle, initial encounter: Secondary | ICD-10-CM | POA: Diagnosis present

## 2016-07-24 MED ORDER — FENTANYL CITRATE (PF) 100 MCG/2ML IJ SOLN
75.0000 ug | Freq: Once | INTRAMUSCULAR | Status: AC
  Administered 2016-07-24: 75 ug via INTRAVENOUS
  Filled 2016-07-24: qty 2

## 2016-07-24 MED ORDER — HYDROCODONE-ACETAMINOPHEN 5-325 MG PO TABS: 1.0000 | ORAL_TABLET | Freq: Four times a day (QID) | ORAL | 0 refills | Status: DC | PRN

## 2016-07-24 MED ORDER — ONDANSETRON 4 MG PO TBDP
4.0000 mg | ORAL_TABLET | Freq: Once | ORAL | Status: AC
  Administered 2016-07-24: 4 mg via ORAL
  Filled 2016-07-24: qty 1

## 2016-07-24 MED ORDER — HYDROMORPHONE HCL 1 MG/ML IJ SOLN
1.0000 mg | Freq: Once | INTRAMUSCULAR | Status: AC
  Administered 2016-07-24: 1 mg via INTRAVENOUS
  Filled 2016-07-24: qty 1

## 2016-07-24 MED ORDER — ACETAMINOPHEN 325 MG PO TABS
650.0000 mg | ORAL_TABLET | Freq: Once | ORAL | Status: AC
  Administered 2016-07-24: 650 mg via ORAL
  Filled 2016-07-24: qty 2

## 2016-07-24 NOTE — ED Provider Notes (Signed)
[facility]-EMERGENCY DEPT Provider Note   CSN: [id number] Arrival date & time: 07/24/16  1519     History   Chief Complaint Chief Complaint  Patient presents with  . Ankle Pain  . Leg Injury    HPI Carol May is a 30 y.o. female.  HPI Pt comes in post fall while roller-skating. She had immediate pain in her R ankle and noted deformity. Pt denies any numbness, tingling and denies pain elsewhere. No hx of trauma at the same site in the past.   Past Medical History:  Diagnosis Date  . H/O varicella   . Medical history non-contributory     Patient Active Problem List   Diagnosis Date Noted  . Normal labor 11/12/2014  . Vaginal delivery 04/21/2012    Past Surgical History:  Procedure Laterality Date  . NO PAST SURGERIES      OB History    Gravida Para Term Preterm AB Living   3 3 3  0 0 3   SAB TAB Ectopic Multiple Live Births   0 0 0 0 3       Home Medications    Prior to Admission medications   Medication Sig Start Date End Date Taking? Authorizing Provider  ibuprofen (ADVIL,MOTRIN) 200 MG tablet Take 400 mg by mouth every 6 (six) hours as needed for moderate pain.   Yes Historical Provider, MD  cetirizine (ZYRTEC ALLERGY) 10 MG tablet Take 1 tablet (10 mg total) by mouth daily. Patient not taking: Reported on 07/24/2016 03/11/16   Everlene Farrier, PA-C  cyclobenzaprine (FLEXERIL) 5 MG tablet Take 1 tablet (5 mg total) by mouth 3 (three) times daily as needed for muscle spasms. Patient not taking: Reported on 11/12/2014 01/02/13   Linwood Dibbles, MD  fluticasone [facility]) 50 MCG/ACT nasal spray Place 2 sprays into both nostrils daily. Patient not taking: Reported on 07/24/2016 03/11/16   Everlene Farrier, PA-C  HYDROcodone-acetaminophen (NORCO/VICODIN) 5-325 MG tablet Take 1 tablet by mouth every 6 (six) hours as needed for severe pain. 07/24/16   Derwood Kaplan, MD  ibuprofen (ADVIL,MOTRIN) 600 MG tablet Take 1 tablet (600 mg total) by mouth every 8 (eight) hours as  needed for pain. Patient not taking: Reported on 11/12/2014 05/31/13   Kermit Balo Tysinger, PA-C  naproxen (NAPROSYN) 250 MG tablet Take 1 tablet (250 mg total) by mouth 2 (two) times daily with a meal. Patient not taking: Reported on 07/24/2016 03/11/16   Everlene Farrier, PA-C    Family History History reviewed. No pertinent family history.  Social History Social History  Substance Use Topics  . Smoking status: Never Smoker  . Smokeless tobacco: Not on file  . Alcohol use No     Allergies   Review of patient's allergies indicates no known allergies.   Review of Systems Review of Systems  Constitutional: Positive for activity change.  Musculoskeletal: Positive for arthralgias. Negative for back pain.  Skin: Negative for wound.  Neurological: Negative for headaches.  Hematological: Does not bruise/bleed easily.     Physical Exam Updated Vital Signs BP 110/69 (BP Location: Left Arm)   Pulse 78   Temp 98.2 F (36.8 C) (Oral)   Resp 18   Ht 5\' 1"  (1.549 m)   Wt 150 lb (68 kg)   LMP 07/17/2016 (Exact Date)   SpO2 93%   Breastfeeding? No   BMI 28.34 kg/m   Physical Exam  Constitutional: She is oriented to person, place, and time. She appears well-developed.  HENT:  Head: Normocephalic and  atraumatic.  Eyes: EOM are normal.  Neck: Normal range of motion. Neck supple.  Cardiovascular: Normal rate.   Pulmonary/Chest: Effort normal.  Abdominal: Bowel sounds are normal.  Musculoskeletal:  Pt has swelling around the ankle and the ankle is plantarflexed to about 45 degrees. Skin is warm, 2+ DP. Sensation is normal and intact.  Neurological: She is alert and oriented to person, place, and time.  Skin: Skin is warm and dry.  Nursing note and vitals reviewed.    ED Treatments / Results  Labs (all labs ordered are listed, but only abnormal results are displayed) Labs Reviewed - No data to display  EKG  EKG Interpretation None       Radiology Dg Ankle Complete  Right  Result Date: 07/24/2016 CLINICAL DATA:  Fall while roller-skating with ankle pain and swelling, initial encounter EXAM: RIGHT ANKLE - COMPLETE 3+ VIEW COMPARISON:  None. FINDINGS: There are changes consistent with a trimalleolar fracture. The talus appears intact. No other fracture is identified. Diffuse soft tissue swelling is noted. IMPRESSION: Trimalleolar fracture Electronically Signed   By: Alcide CleverMark  Lukens M.D.   On: 07/24/2016 16:46    Procedures .Splint Application Date/Time: 07/24/2016 5:21 PM Performed by: Derwood KaplanNANAVATI, Harriet Sutphen Authorized by: Derwood KaplanNANAVATI, Burlin Mcnair   Consent:    Consent obtained:  Verbal   Consent given by:  Patient   Risks discussed:  Discoloration Pre-procedure details:    Sensation:  Normal Procedure details:    Laterality:  Right   Location:  Ankle   Ankle:  R ankle   Strapping: yes     Cast type:  Short leg   Splint type:  Ankle stirrup   Supplies:  Elastic bandage, plaster, prefabricated splint and cotton padding Post-procedure details:    Pain:  Improved   Sensation:  Normal   Patient tolerance of procedure:  Tolerated well, no immediate complications Reduction of fracture Date/Time: 07/24/2016 5:24 PM Performed by: Derwood KaplanNANAVATI, Takai Chiaramonte Authorized by: Derwood KaplanNANAVATI, Rastus Borton  Consent: Verbal consent obtained. Risks and benefits: risks, benefits and alternatives were discussed Consent given by: patient Patient understanding: patient states understanding of the procedure being performed Site marked: the operative site was marked Imaging studies: imaging studies available Patient identity confirmed: arm band Time out: Immediately prior to procedure a "time out" was called to verify the correct patient, procedure, equipment, support staff and site/side marked as required. Local anesthesia used: no  Anesthesia: Local anesthesia used: no  Sedation: Patient sedated: no Patient tolerance: Patient tolerated the procedure well with no immediate complications Comments:  Pt's foot was at 45 degrees initially, but we were able to reduce it to about 80 degrees at the time of splinting.    (including critical care time)  Medications Ordered in ED Medications  HYDROmorphone (DILAUDID) injection 1 mg (1 mg Intravenous Given 07/24/16 1609)  fentaNYL (SUBLIMAZE) injection 75 mcg (75 mcg Intravenous Given 07/24/16 1725)     Initial Impression / Assessment and Plan / ED Course  I have reviewed the triage vital signs and the nursing notes.  Pertinent labs & imaging results that were available during my care of the patient were reviewed by me and considered in my medical decision making (see chart for details).  Clinical Course   Pt comes in post fall that resulted in trimalleolar ankle fracture. Neurovascularly intact. She has plantar flexed her ankle, and we gently reduced the fracture prior to splinting the ankle to close to 80 degrees.   Final Clinical Impressions(s) / ED Diagnoses   Final diagnoses:  Trimalleolar fracture of ankle, closed, right, initial encounter    New Prescriptions New Prescriptions   HYDROCODONE-ACETAMINOPHEN (NORCO/VICODIN) 5-325 MG TABLET    Take 1 tablet by mouth every 6 (six) hours as needed for severe pain.     Derwood KaplanAnkit Kilo Eshelman, MD 07/24/16 1757

## 2016-07-24 NOTE — ED Notes (Signed)
Patient transported to X-ray 

## 2016-07-24 NOTE — ED Triage Notes (Signed)
Pt was roller skating and fell. Pain to R ankle, deformity present. Pt given Fentanyl IV by EMS. Pt states pain remains 10/10. C/o of nausea on arrival.

## 2016-07-24 NOTE — Discharge Instructions (Signed)
See Dr. Victorino DikeHewitt in 3-5 days. No weight bearing. Keep the leg elevated.

## 2016-07-27 ENCOUNTER — Other Ambulatory Visit: Payer: Self-pay | Admitting: Orthopedic Surgery

## 2016-07-27 ENCOUNTER — Ambulatory Visit
Admission: RE | Admit: 2016-07-27 | Discharge: 2016-07-27 | Disposition: A | Discharge status: Home / Self Care | Payer: Medicaid Other | Source: Ambulatory Visit | Attending: Orthopedic Surgery | Admitting: Orthopedic Surgery

## 2016-07-27 DIAGNOSIS — S82401A Unspecified fracture of shaft of right fibula, initial encounter for closed fracture: Secondary | ICD-10-CM

## 2016-07-27 DIAGNOSIS — S82201A Unspecified fracture of shaft of right tibia, initial encounter for closed fracture: Secondary | ICD-10-CM

## 2016-07-31 ENCOUNTER — Telehealth: Payer: Self-pay | Admitting: Medical

## 2016-07-31 NOTE — Telephone Encounter (Signed)
Have them come in for emergency department follow up.  They were seen recently in the emergency dept.  We haven't seen this patient in a while.  If they are not wishing to return, then remove me as PCP

## 2016-08-03 ENCOUNTER — Encounter (HOSPITAL_BASED_OUTPATIENT_CLINIC_OR_DEPARTMENT_OTHER): Payer: Self-pay | Admitting: *Deleted

## 2016-08-05 ENCOUNTER — Other Ambulatory Visit: Payer: Self-pay | Admitting: Orthopedic Surgery

## 2016-08-08 NOTE — Telephone Encounter (Signed)
This patient hasn't been seen here for 3 years.  Removing pcp.

## 2016-08-11 ENCOUNTER — Ambulatory Visit (HOSPITAL_BASED_OUTPATIENT_CLINIC_OR_DEPARTMENT_OTHER)
Admission: RE | Admit: 2016-08-11 | Discharge: 2016-08-11 | Disposition: A | Discharge status: Home / Self Care | Payer: Medicaid Other | Source: Ambulatory Visit | Attending: Orthopedic Surgery | Admitting: Orthopedic Surgery

## 2016-08-11 ENCOUNTER — Ambulatory Visit (HOSPITAL_BASED_OUTPATIENT_CLINIC_OR_DEPARTMENT_OTHER): Payer: Medicaid Other | Admitting: Anesthesiology

## 2016-08-11 ENCOUNTER — Encounter (HOSPITAL_BASED_OUTPATIENT_CLINIC_OR_DEPARTMENT_OTHER)
Admission: RE | Disposition: A | Discharge status: Home / Self Care | Payer: Self-pay | Source: Ambulatory Visit | Attending: Orthopedic Surgery

## 2016-08-11 ENCOUNTER — Encounter (HOSPITAL_BASED_OUTPATIENT_CLINIC_OR_DEPARTMENT_OTHER): Payer: Self-pay

## 2016-08-11 DIAGNOSIS — S82201A Unspecified fracture of shaft of right tibia, initial encounter for closed fracture: Secondary | ICD-10-CM | POA: Diagnosis not present

## 2016-08-11 DIAGNOSIS — S82401A Unspecified fracture of shaft of right fibula, initial encounter for closed fracture: Secondary | ICD-10-CM | POA: Insufficient documentation

## 2016-08-11 DIAGNOSIS — W19XXXA Unspecified fall, initial encounter: Secondary | ICD-10-CM | POA: Diagnosis not present

## 2016-08-11 HISTORY — PX: OPEN REDUCTION INTERNAL FIXATION (ORIF) TIBIA/FIBULA FRACTURE: SHX5992

## 2016-08-11 SURGERY — OPEN REDUCTION INTERNAL FIXATION (ORIF) TIBIA/FIBULA FRACTURE
Anesthesia: Regional | Site: Leg Lower | Laterality: Right

## 2016-08-11 MED ORDER — GLYCOPYRROLATE 0.2 MG/ML IV SOSY
PREFILLED_SYRINGE | INTRAVENOUS | Status: AC
  Filled 2016-08-11: qty 3

## 2016-08-11 MED ORDER — FENTANYL CITRATE (PF) 100 MCG/2ML IJ SOLN
INTRAMUSCULAR | Status: AC
  Filled 2016-08-11: qty 2

## 2016-08-11 MED ORDER — PHENYLEPHRINE 40 MCG/ML (10ML) SYRINGE FOR IV PUSH (FOR BLOOD PRESSURE SUPPORT)
PREFILLED_SYRINGE | INTRAVENOUS | Status: AC
  Filled 2016-08-11: qty 10

## 2016-08-11 MED ORDER — DEXAMETHASONE SODIUM PHOSPHATE 10 MG/ML IJ SOLN
INTRAMUSCULAR | Status: AC
  Filled 2016-08-11: qty 1

## 2016-08-11 MED ORDER — LIDOCAINE 2% (20 MG/ML) 5 ML SYRINGE
INTRAMUSCULAR | Status: AC
  Filled 2016-08-11: qty 5

## 2016-08-11 MED ORDER — KETOROLAC TROMETHAMINE 30 MG/ML IJ SOLN
INTRAMUSCULAR | Status: DC | PRN
  Administered 2016-08-11: 30 mg via INTRAVENOUS

## 2016-08-11 MED ORDER — 0.9 % SODIUM CHLORIDE (POUR BTL) OPTIME
TOPICAL | Status: DC | PRN
  Administered 2016-08-11: 400 mL

## 2016-08-11 MED ORDER — MIDAZOLAM HCL 2 MG/2ML IJ SOLN
INTRAMUSCULAR | Status: AC
  Filled 2016-08-11: qty 2

## 2016-08-11 MED ORDER — SCOPOLAMINE 1 MG/3DAYS TD PT72: 1.0000 | MEDICATED_PATCH | Freq: Once | TRANSDERMAL | Status: DC | PRN

## 2016-08-11 MED ORDER — GLYCOPYRROLATE 0.2 MG/ML IJ SOLN: 0.2000 mg | Freq: Once | INTRAMUSCULAR | Status: DC | PRN

## 2016-08-11 MED ORDER — DEXAMETHASONE SODIUM PHOSPHATE 10 MG/ML IJ SOLN
INTRAMUSCULAR | Status: DC | PRN
  Administered 2016-08-11: 10 mg via INTRAVENOUS

## 2016-08-11 MED ORDER — EPHEDRINE SULFATE 50 MG/ML IJ SOLN
INTRAMUSCULAR | Status: DC | PRN
  Administered 2016-08-11: 10 mg via INTRAVENOUS

## 2016-08-11 MED ORDER — OXYCODONE HCL 5 MG PO TABS: 5.0000 mg | ORAL_TABLET | ORAL | 0 refills | Status: DC | PRN

## 2016-08-11 MED ORDER — MIDAZOLAM HCL 5 MG/5ML IJ SOLN
INTRAMUSCULAR | Status: DC | PRN
  Administered 2016-08-11: 2 mg via INTRAVENOUS

## 2016-08-11 MED ORDER — MIDAZOLAM HCL 2 MG/2ML IJ SOLN
1.0000 mg | INTRAMUSCULAR | Status: DC | PRN
  Administered 2016-08-11: 1 mg via INTRAVENOUS

## 2016-08-11 MED ORDER — EPHEDRINE 5 MG/ML INJ
INTRAVENOUS | Status: AC
  Filled 2016-08-11: qty 10

## 2016-08-11 MED ORDER — FENTANYL CITRATE (PF) 100 MCG/2ML IJ SOLN
50.0000 ug | INTRAMUSCULAR | Status: DC | PRN
  Administered 2016-08-11: 50 ug via INTRAVENOUS

## 2016-08-11 MED ORDER — FENTANYL CITRATE (PF) 100 MCG/2ML IJ SOLN: 25.0000 ug | INTRAMUSCULAR | Status: DC | PRN

## 2016-08-11 MED ORDER — CHLORHEXIDINE GLUCONATE 4 % EX LIQD: 60.0000 mL | Freq: Once | CUTANEOUS | Status: DC

## 2016-08-11 MED ORDER — BUPIVACAINE-EPINEPHRINE (PF) 0.5% -1:200000 IJ SOLN
INTRAMUSCULAR | Status: DC | PRN
  Administered 2016-08-11: 30 mL via PERINEURAL
  Administered 2016-08-11: 20 mL via PERINEURAL

## 2016-08-11 MED ORDER — PROPOFOL 10 MG/ML IV BOLUS
INTRAVENOUS | Status: DC | PRN
  Administered 2016-08-11: 100 mg via INTRAVENOUS
  Administered 2016-08-11: 200 mg via INTRAVENOUS

## 2016-08-11 MED ORDER — SODIUM CHLORIDE 0.9 % IV SOLN
INTRAVENOUS | Status: DC
Start: 2016-08-11 — End: 2016-08-11

## 2016-08-11 MED ORDER — LACTATED RINGERS IV SOLN
INTRAVENOUS | Status: DC
  Administered 2016-08-11 (×3): via INTRAVENOUS

## 2016-08-11 MED ORDER — CEFAZOLIN SODIUM-DEXTROSE 2-4 GM/100ML-% IV SOLN
2.0000 g | INTRAVENOUS | Status: AC
  Administered 2016-08-11: 2 g via INTRAVENOUS

## 2016-08-11 MED ORDER — ASPIRIN EC 81 MG PO TBEC: 81.0000 mg | DELAYED_RELEASE_TABLET | Freq: Two times a day (BID) | ORAL | 0 refills | Status: DC

## 2016-08-11 MED ORDER — PROMETHAZINE HCL 25 MG/ML IJ SOLN: 6.2500 mg | INTRAMUSCULAR | Status: DC | PRN

## 2016-08-11 MED ORDER — KETOROLAC TROMETHAMINE 30 MG/ML IJ SOLN
INTRAMUSCULAR | Status: AC
  Filled 2016-08-11: qty 1

## 2016-08-11 MED ORDER — FENTANYL CITRATE (PF) 100 MCG/2ML IJ SOLN
INTRAMUSCULAR | Status: DC | PRN
  Administered 2016-08-11: 50 ug via INTRAVENOUS
  Administered 2016-08-11: 25 ug via INTRAVENOUS
  Administered 2016-08-11 (×2): 50 ug via INTRAVENOUS
  Administered 2016-08-11: 25 ug via INTRAVENOUS

## 2016-08-11 MED ORDER — ONDANSETRON HCL 4 MG/2ML IJ SOLN
INTRAMUSCULAR | Status: AC
  Filled 2016-08-11: qty 2

## 2016-08-11 MED ORDER — PHENYLEPHRINE HCL 10 MG/ML IJ SOLN
INTRAMUSCULAR | Status: DC | PRN
Start: 2016-08-11 — End: 2016-08-11
  Administered 2016-08-11 (×7): 80 ug via INTRAVENOUS

## 2016-08-11 MED ORDER — PROPOFOL 10 MG/ML IV BOLUS
INTRAVENOUS | Status: AC
  Filled 2016-08-11: qty 20

## 2016-08-11 MED ORDER — LIDOCAINE HCL (CARDIAC) 20 MG/ML IV SOLN
INTRAVENOUS | Status: DC | PRN
  Administered 2016-08-11: 50 mg via INTRAVENOUS

## 2016-08-11 SURGICAL SUPPLY — 75 items
BAG DECANTER FOR FLEXI CONT (MISCELLANEOUS) IMPLANT
BANDAGE ESMARK 6X9 LF (GAUZE/BANDAGES/DRESSINGS) ×1 IMPLANT
BIT DRILL 2.5X2.75 QC CALB (BIT) ×2 IMPLANT
BIT DRILL 2.9 CANN QC NONSTRL (BIT) ×2 IMPLANT
BLADE SURG 15 STRL LF DISP TIS (BLADE) ×2 IMPLANT
BLADE SURG 15 STRL SS (BLADE) ×2
BNDG COHESIVE 4X5 TAN STRL (GAUZE/BANDAGES/DRESSINGS) ×2 IMPLANT
BNDG COHESIVE 6X5 TAN STRL LF (GAUZE/BANDAGES/DRESSINGS) ×2 IMPLANT
BNDG ESMARK 6X9 LF (GAUZE/BANDAGES/DRESSINGS) ×2
CHLORAPREP W/TINT 26ML (MISCELLANEOUS) ×2 IMPLANT
COVER BACK TABLE 60X90IN (DRAPES) ×2 IMPLANT
CUFF TOURNIQUET SINGLE 34IN LL (TOURNIQUET CUFF) ×2 IMPLANT
DECANTER SPIKE VIAL GLASS SM (MISCELLANEOUS) IMPLANT
DRAPE EXTREMITY T 121X128X90 (DRAPE) ×2 IMPLANT
DRAPE OEC MINIVIEW 54X84 (DRAPES) ×2 IMPLANT
DRAPE U-SHAPE 47X51 STRL (DRAPES) ×2 IMPLANT
DRSG MEPITEL 4X7.2 (GAUZE/BANDAGES/DRESSINGS) ×2 IMPLANT
DRSG PAD ABDOMINAL 8X10 ST (GAUZE/BANDAGES/DRESSINGS) ×4 IMPLANT
ELECT REM PT RETURN 9FT ADLT (ELECTROSURGICAL) ×2
ELECTRODE REM PT RTRN 9FT ADLT (ELECTROSURGICAL) ×1 IMPLANT
GAUZE SPONGE 4X4 12PLY STRL (GAUZE/BANDAGES/DRESSINGS) ×2 IMPLANT
GLOVE BIO SURGEON STRL SZ8 (GLOVE) ×2 IMPLANT
GLOVE BIOGEL PI IND STRL 7.0 (GLOVE) ×1 IMPLANT
GLOVE BIOGEL PI IND STRL 8 (GLOVE) ×2 IMPLANT
GLOVE BIOGEL PI INDICATOR 7.0 (GLOVE) ×1
GLOVE BIOGEL PI INDICATOR 8 (GLOVE) ×2
GLOVE ECLIPSE 6.5 STRL STRAW (GLOVE) ×2 IMPLANT
GLOVE ECLIPSE 7.5 STRL STRAW (GLOVE) ×2 IMPLANT
GLOVE EXAM NITRILE MD LF STRL (GLOVE) IMPLANT
GOWN STRL REUS W/ TWL LRG LVL3 (GOWN DISPOSABLE) ×1 IMPLANT
GOWN STRL REUS W/ TWL XL LVL3 (GOWN DISPOSABLE) ×2 IMPLANT
GOWN STRL REUS W/TWL LRG LVL3 (GOWN DISPOSABLE) ×1
GOWN STRL REUS W/TWL XL LVL3 (GOWN DISPOSABLE) ×2
K-WIRE ACE 1.6X6 (WIRE) ×6
KWIRE ACE 1.6X6 (WIRE) ×3 IMPLANT
NEEDLE HYPO 22GX1.5 SAFETY (NEEDLE) IMPLANT
NS IRRIG 1000ML POUR BTL (IV SOLUTION) ×2 IMPLANT
PACK BASIN DAY SURGERY FS (CUSTOM PROCEDURE TRAY) ×2 IMPLANT
PAD CAST 4YDX4 CTTN HI CHSV (CAST SUPPLIES) IMPLANT
PADDING CAST ABS 4INX4YD NS (CAST SUPPLIES)
PADDING CAST ABS COTTON 4X4 ST (CAST SUPPLIES) IMPLANT
PADDING CAST COTTON 4X4 STRL (CAST SUPPLIES)
PENCIL BUTTON HOLSTER BLD 10FT (ELECTRODE) ×2 IMPLANT
PLATE ACE 3.5MM 2HOLE (Plate) ×2 IMPLANT
PLATE TUB 100DEG 5 HO (Plate) ×2 IMPLANT
SANITIZER HAND PURELL 535ML FO (MISCELLANEOUS) ×2 IMPLANT
SCREW ACE CAN 4.0 30M (Screw) ×4 IMPLANT
SCREW CORTICAL 3.5MM  16MM (Screw) ×3 IMPLANT
SCREW CORTICAL 3.5MM  28MM (Screw) ×1 IMPLANT
SCREW CORTICAL 3.5MM  46MM (Screw) ×1 IMPLANT
SCREW CORTICAL 3.5MM 14MM (Screw) ×2 IMPLANT
SCREW CORTICAL 3.5MM 16MM (Screw) ×3 IMPLANT
SCREW CORTICAL 3.5MM 18MM (Screw) ×2 IMPLANT
SCREW CORTICAL 3.5MM 28MM (Screw) ×1 IMPLANT
SCREW CORTICAL 3.5MM 38MM (Screw) ×2 IMPLANT
SCREW CORTICAL 3.5MM 46MM (Screw) ×1 IMPLANT
SHEET MEDIUM DRAPE 40X70 STRL (DRAPES) ×2 IMPLANT
SLEEVE SCD COMPRESS KNEE MED (MISCELLANEOUS) ×2 IMPLANT
SPLINT FAST PLASTER 5X30 (CAST SUPPLIES) ×20
SPLINT PLASTER CAST FAST 5X30 (CAST SUPPLIES) ×20 IMPLANT
SPONGE LAP 18X18 X RAY DECT (DISPOSABLE) ×2 IMPLANT
STOCKINETTE 6  STRL (DRAPES) ×1
STOCKINETTE 6 STRL (DRAPES) ×1 IMPLANT
SUCTION FRAZIER HANDLE 10FR (MISCELLANEOUS) ×1
SUCTION TUBE FRAZIER 10FR DISP (MISCELLANEOUS) ×1 IMPLANT
SUT ETHILON 3 0 PS 1 (SUTURE) ×2 IMPLANT
SUT MNCRL AB 3-0 PS2 18 (SUTURE) ×2 IMPLANT
SUT VIC AB 0 SH 27 (SUTURE) IMPLANT
SUT VIC AB 2-0 SH 27 (SUTURE)
SUT VIC AB 2-0 SH 27XBRD (SUTURE) IMPLANT
SYR BULB 3OZ (MISCELLANEOUS) ×2 IMPLANT
SYR CONTROL 10ML LL (SYRINGE) ×2 IMPLANT
TOWEL OR 17X24 6PK STRL BLUE (TOWEL DISPOSABLE) ×4 IMPLANT
TUBE CONNECTING 20X1/4 (TUBING) ×4 IMPLANT
UNDERPAD 30X30 (UNDERPADS AND DIAPERS) ×2 IMPLANT

## 2016-08-11 NOTE — Anesthesia Procedure Notes (Signed)
Anesthesia Regional Block:  Nerve block type: Saphenous nerve block.  Pre-Anesthetic Checklist: ,, timeout performed, Correct Patient, Correct Site, Correct Laterality, Correct Procedure, Correct Position, site marked, Risks and benefits discussed,  Surgical consent,  Pre-op evaluation,  At surgeon's request and post-op pain management  Laterality: Right  Prep: chloraprep       Needles:  Injection technique: Single-shot  Needle Type: Echogenic Stimulator Needle     Needle Length: 10cm 10 cm Needle Gauge: 21 and 21 G    Additional Needles:  Procedures: ultrasound guided (picture in chart) Nerve block type: Saphenous nerve block. Narrative:  Start time: 08/11/2016 11:54 AM End time: 08/11/2016 11:56 AM Injection made incrementally with aspirations every 5 mL.  Performed by: Personally  Anesthesiologist: Linton RumpALLAN, Eldean Nanna DICKERSON

## 2016-08-11 NOTE — H&P (Signed)
Carol May is an 30 y.o. female.   Chief Complaint: right ankle injury HPI: 30 y/o female with right fibula and distal tibia fractures after a fall approx 2 weeks ago.  She presents today for ORIF of these unstable and displaced fractures.  Past Medical History:  Diagnosis Date  . H/O varicella   . Medical history non-contributory     Past Surgical History:  Procedure Laterality Date  . NO PAST SURGERIES      History reviewed. No pertinent family history. Social History:  reports that she has never smoked. She has never used smokeless tobacco. She reports that she does not drink alcohol or use drugs.  Allergies: No Known Allergies  Medications Prior to Admission  Medication Sig Dispense Refill  . HYDROcodone-acetaminophen (NORCO/VICODIN) 5-325 MG tablet Take 1 tablet by mouth every 6 (six) hours as needed for severe pain. 15 tablet 0    No results found for this or any previous visit (from the past 48 hour(s)). No results found.  ROS  No recent f/c/n/v/wt loss  Blood pressure 99/64, pulse 71, temperature 99.2 F (37.3 C), temperature source Oral, resp. rate 20, height 5\' 1"  (1.549 m), weight 77.1 kg (170 lb), last menstrual period 07/27/2016, SpO2 97 %, not currently breastfeeding. Physical Exam  wn wd woman in nad.  A and O x 4.  Mood and affect normal.  EOMI.  resp unlabored.  R ankle with healthy skin.  NVI.  No lymphadenopathy.  5/5 strength in PF and DF of the toes.  Assessment/Plan R distal tibia and fibula fractures - to OR for ORIF.  The risks and benefits of the alternative treatment options have been discussed in detail.  The patient wishes to proceed with surgery and specifically understands risks of bleeding, infection, nerve damage, blood clots, need for additional surgery, amputation and death.   Toni ArthursHEWITT, Evaristo Tsuda, MD 08/11/2016, 12:26 PM

## 2016-08-11 NOTE — Discharge Instructions (Addendum)
Toni Arthurs, MD [facility]  Please read the following information regarding your care after surgery.  Medications  You only need a prescription for the narcotic pain medicine (ex. oxycodone, Percocet, Norco).  All of the other medicines listed below are available over the counter. X acetominophen (Tylenol) 650 mg every 4-6 hours as you need for minor pain X oxycodone as prescribed for moderate to severe pain X ibuprofen 800 mg every 8 hours for 3 days after surgery   Narcotic pain medicine (ex. oxycodone, Percocet, Vicodin) will cause constipation.  To prevent this problem, take the following medicines while you are taking any pain medicine. X docusate sodium (Colace) 100 mg twice a day X senna (Senokot) 2 tablets twice a day   Post Anesthesia Home Care Instructions  Activity: Get plenty of rest for the remainder of the day. A responsible adult should stay with you for 24 hours following the procedure.  For the next 24 hours, DO NOT: -Drive a car -Advertising copywriter -Drink alcoholic beverages -Take any medication unless instructed by your physician -Make any legal decisions or sign important papers.  Meals: Start with liquid foods such as gelatin or soup. Progress to regular foods as tolerated. Avoid greasy, spicy, heavy foods. If nausea and/or vomiting occur, drink only clear liquids until the nausea and/or vomiting subsides. Call your physician if vomiting continues.  Special Instructions/Symptoms: Your throat may feel dry or sore from the anesthesia or the breathing tube placed in your throat during surgery. If this causes discomfort, gargle with warm salt water. The discomfort should disappear within 24 hours.  If you had a scopolamine patch placed behind your ear for the management of post- operative nausea and/or vomiting:  1. The medication in the patch is effective for 72 hours, after which it should be removed.  Wrap patch in a tissue and discard in the trash.  Wash hands thoroughly with soap and water. 2. You may remove the patch earlier than 72 hours if you experience unpleasant side effects which may include dry mouth, dizziness or visual disturbances. 3. Avoid touching the patch. Wash your hands with soap and water after contact with the patch.   Regional Anesthesia Blocks  1. Numbness or the inability to move the "blocked" extremity may last from 3-48 hours after placement. The length of time depends on the medication injected and your individual response to the medication. If the numbness is not going away after 48 hours, call your surgeon.  2. The extremity that is blocked will need to be protected until the numbness is gone and the  Strength has returned. Because you cannot feel it, you will need to take extra care to avoid injury. Because it may be weak, you may have difficulty moving it or using it. You may not know what position it is in without looking at it while the block is in effect.  3. For blocks in the legs and feet, returning to weight bearing and walking needs to be done carefully. You will need to wait until the numbness is entirely gone and the strength has returned. You should be able to move your leg and foot normally before you try and bear weight or walk. You will need someone to be with you when you first try to ensure you do not fall and possibly risk injury.  4. Bruising and tenderness at the needle site are common side effects and will resolve in a few days.  5. Persistent numbness or new problems with movement  should be communicated to the surgeon or the [facility] [phone])/ [facility] [phone]).  X To help prevent blood clots, take a baby aspirin (81 mg) twice a day for two weeks after surgery.  You should also get up every hour while you are awake to move around.    Weight Bearing ? Bear weight when you are able on your operated leg or foot. ? Bear weight only on the heel of your  operated foot in the post-op shoe. X Do not bear any weight on the operated leg or foot.  Cast / Splint / Dressing X Keep your splint or cast clean and dry.  Dont put anything (coat hanger, pencil, etc) down inside of it.  If it gets damp, use a hair dryer on the cool setting to dry it.  If it gets soaked, call the office to schedule an appointment for a cast change. ? Remove your dressing 3 days after surgery and cover the incisions with dry dressings.    After your dressing, cast or splint is removed; you may shower, but do not soak or scrub the wound.  Allow the water to run over it, and then gently pat it dry.  Swelling It is normal for you to have swelling where you had surgery.  To reduce swelling and pain, keep your toes above your nose for at least 3 days after surgery.  It may be necessary to keep your foot or leg elevated for several weeks.  If it hurts, it should be elevated.  Follow Up Call my office at [phone] when you are discharged from the hospital or surgery center to schedule an appointment to be seen two weeks after surgery.  Call my office at [phone] if you develop a fever >101.5 F, nausea, vomiting, bleeding from the surgical site or severe pain.

## 2016-08-11 NOTE — Anesthesia Procedure Notes (Signed)
Anesthesia Regional Block:  Popliteal block  Pre-Anesthetic Checklist: ,, timeout performed, Correct Patient, Correct Site, Correct Laterality, Correct Procedure, Correct Position, site marked, Risks and benefits discussed,  Surgical consent,  Pre-op evaluation,  At surgeon's request and post-op pain management  Laterality: Right  Prep: chloraprep       Needles:   Needle Type: Echogenic Stimulator Needle     Needle Length: 10cm 10 cm Needle Gauge: 21 and 21 G    Additional Needles:  Procedures: ultrasound guided (picture in chart) Popliteal block Narrative:  Start time: 08/11/2016 11:50 AM End time: 08/11/2016 11:54 AM Injection made incrementally with aspirations every 5 mL.  Performed by: Personally  Anesthesiologist: Linton RumpALLAN, Liliyana Thobe DICKERSON  Additional Notes: Denies numbness in RLE.

## 2016-08-11 NOTE — Progress Notes (Signed)
Assisted Dr. Jennifer Allan with right, ultrasound guided, popliteal/saphenous block. Side rails up, monitors on throughout procedure. See vital signs in flow sheet. Tolerated Procedure well. 

## 2016-08-11 NOTE — Anesthesia Procedure Notes (Signed)
Procedure Name: LMA Insertion Date/Time: 08/11/2016 12:56 PM Performed by: Genevieve NorlanderLINKA, Rasheedah Reis L Pre-anesthesia Checklist: Patient identified, Emergency Drugs available, Suction available, Patient being monitored and Timeout performed Patient Re-evaluated:Patient Re-evaluated prior to inductionOxygen Delivery Method: Circle system utilized Preoxygenation: Pre-oxygenation with 100% oxygen Intubation Type: IV induction Ventilation: Mask ventilation without difficulty LMA: LMA inserted LMA Size: 4.0 Number of attempts: 1 Airway Equipment and Method: Bite block Placement Confirmation: positive ETCO2 Tube secured with: Tape Dental Injury: Teeth and Oropharynx as per pre-operative assessment

## 2016-08-11 NOTE — Anesthesia Preprocedure Evaluation (Addendum)
Anesthesia Evaluation  Patient identified by MRN, date of birth, ID band Patient awake    Reviewed: Allergy & Precautions, NPO status , Patient's Chart, lab work & pertinent test results  History of Anesthesia Complications Negative for: history of anesthetic complications  Airway Mallampati: II  TM Distance: >3 FB Neck ROM: Full    Dental  (+) Teeth Intact, Dental Advisory Given   Pulmonary neg pulmonary ROS,    Pulmonary exam normal breath sounds clear to auscultation       Cardiovascular negative cardio ROS   Rhythm:Regular Rate:Normal     Neuro/Psych negative neurological ROS     GI/Hepatic negative GI ROS, Neg liver ROS,   Endo/Other  negative endocrine ROS  Renal/GU negative Renal ROS     Musculoskeletal   Abdominal (+) + obese,   Peds  Hematology negative hematology ROS (+)   Anesthesia Other Findings   Reproductive/Obstetrics                            Anesthesia Physical Anesthesia Plan  ASA: I  Anesthesia Plan: General and Regional   Post-op Pain Management: GA combined w/ Regional for post-op pain   Induction: Intravenous  Airway Management Planned: LMA  Additional Equipment:   Intra-op Plan:   Post-operative Plan: Extubation in OR  Informed Consent: I have reviewed the patients History and Physical, chart, labs and discussed the procedure including the risks, benefits and alternatives for the proposed anesthesia with the patient or authorized representative who has indicated his/her understanding and acceptance.   Dental advisory given  Plan Discussed with:   Anesthesia Plan Comments: (Risks of general anesthesia discussed including, but not limited to, sore throat, hoarse voice, chipped/damaged teeth, injury to vocal cords, nausea and vomiting, allergic reactions, lung infection, heart attack, stroke, and death. All questions answered.  Discussed potential  risks of nerve blocks including, but not limited to, infection, bleeding, nerve damage, seizures, pneumothorax, respiratory depression, and potential failure of the block. Alternatives to nerve blocks discussed. All questions answered. )       Anesthesia Quick Evaluation

## 2016-08-11 NOTE — Transfer of Care (Signed)
Immediate Anesthesia Transfer of Care Note  Patient: Carol PereyraLourdes E May  Procedure(s) Performed: Procedure(s): OPEN REDUCTION INTERNAL FIXATION (ORIF) RIGHT TIBIA/FIBULA  PILON FRACTURE (Right)  Patient Location: PACU  Anesthesia Type:GA combined with regional for post-op pain  Level of Consciousness: sedated  Airway & Oxygen Therapy: Patient Spontanous Breathing and Patient connected to face mask oxygen  Post-op Assessment: Report given to RN and Post -op Vital signs reviewed and stable  Post vital signs: Reviewed and stable  Last Vitals:  Vitals:   08/11/16 1111 08/11/16 1456  BP: 99/64 (!) (P) 103/56  Pulse: 71 97  Resp: 20 (P) 11  Temp: 37.3 C (P) 36.6 C    Last Pain:  Vitals:   08/11/16 1111  TempSrc: Oral         Complications: No apparent anesthesia complications

## 2016-08-11 NOTE — Brief Op Note (Signed)
08/11/2016  3:21 PM  PATIENT:  Carol PereyraLourdes E May  30 y.o. female  PRE-OPERATIVE DIAGNOSIS:  RIGHT TIBIAL PILON AND FIBULAR FRACTURES  POST-OPERATIVE DIAGNOSIS:  RIGHT TIBIAL PILON AND FIBULAR FRACTURES  Procedure(s): 1.  OPEN REDUCTION INTERNAL FIXATION (ORIF) RIGHT TIBIA/FIBULA  PILON FRACTURE 2.  Stress exam of right ankle 3.  AP, mortise and lateral xrays of the right ankle  SURGEON:  Toni ArthursJohn Carnel Stegman, MD  ASSISTANT: n/a  ANESTHESIA:   General, regional  EBL:  minimal   TOURNIQUET:   Total Tourniquet Time Documented: Thigh (Right) - 87 minutes Total: Thigh (Right) - 87 minutes  COMPLICATIONS:  None apparent  DISPOSITION:  Extubated, awake and stable to recovery.  DICTATION ID:  [id number]

## 2016-08-11 NOTE — Anesthesia Postprocedure Evaluation (Signed)
Anesthesia Post Note  Patient: Delila PereyraLourdes E PerezJuarez  Procedure(s) Performed: Procedure(s) (LRB): OPEN REDUCTION INTERNAL FIXATION (ORIF) RIGHT TIBIA/FIBULA  PILON FRACTURE (Right)  Patient location during evaluation: PACU Anesthesia Type: General and Regional Level of consciousness: awake and alert Pain management: pain level controlled Vital Signs Assessment: post-procedure vital signs reviewed and stable Respiratory status: spontaneous breathing, nonlabored ventilation and respiratory function stable Cardiovascular status: blood pressure returned to baseline and stable Postop Assessment: no signs of nausea or vomiting Anesthetic complications: no    Last Vitals:  Vitals:   08/11/16 1530 08/11/16 1545  BP: 111/76 110/71  Pulse: 89 88  Resp: 13 13  Temp:      Last Pain:  Vitals:   08/11/16 1545  TempSrc:   PainSc: 0-No pain        RLE Motor Response: No movement due to regional block (08/11/16 1545) RLE Sensation: No sensation (absent) (08/11/16 1545)      Linton RumpJennifer Dickerson Claribel Sachs

## 2016-08-12 NOTE — Op Note (Signed)
NAMMilagros Reap:  PEREZJUAREZ, LOURDES         ACCOUNT NO.:  [id number]  MEDICAL RECORD NO.:  [id number]  LOCATION:                                 FACILITY:  PHYSICIAN:  Toni ArthursJohn Coralynn Gaona, MD        DATE OF BIRTH:  September 22, 1986  DATE OF PROCEDURE:  08/11/2016 DATE OF DISCHARGE:                              OPERATIVE REPORT   PREOPERATIVE DIAGNOSIS:  Right tibial pilon and fibula fractures.  POSTOPERATIVE DIAGNOSIS:  Right tibial pilon and fibula fractures.  PROCEDURE: 1. Open reduction and internal fixation of right tibial pilon and     fibula fractures. 2. Stress examination of the right ankle under fluoroscopy. 3. AP, mortise, and lateral radiographs of the right ankle.  SURGEON:  Toni ArthursJohn Meylin Stenzel, MD.  ANESTHESIA:  General, regional.  ESTIMATED BLOOD LOSS:  Minimal.  TOURNIQUET TIME:  87 minutes at 250 mmHg.  COMPLICATIONS:  None apparent.  DISPOSITION:  Extubated, awake, and stable to recovery.  INDICATIONS FOR PROCEDURE:  The patient is a 30 year old female, [place] and injured her right ankle.  Radiographs and a CT scan show a tibial pilon fracture as well as a distal fibular fracture.  She presents today for operative treatment of this unstable and displaced intra-articular injuries of her right ankle.  She understands the risks and benefits of the alternative treatment options and elects surgical treatment.  She specifically understands risks of bleeding, infection, nerve damage, blood clots, need for additional surgery, continued pain, nonunion, amputation, posttraumatic arthritis, and death.  PROCEDURE IN DETAIL:  After preoperative consent was obtained and the correct operative site was identified, the patient was brought to the operating room and placed supine on the operating table.  General anesthesia was induced.  Preoperative antibiotics were administered. Surgical time-out was taken.  The right lower extremity was prepped  and draped in standard sterile fashion with tourniquet around the thigh. The extremity was exsanguinated and tourniquet was inflated to 250 mmHg. The patient had previously been turned in the lateral position on a beanbag with all bony prominences padded well.  A posterolateral incision was then made on the ankle and sharp dissection was carried down through skin and subcutaneous tissue.  Care was taken to protect branches of the sural nerve.  The interval between the flexor hallucis longus and peroneals was developed.  The posterior malleolus fracture line was identified.  It was opened and a Joker elevator was used to elevate the depressed fragment at the articular surface.  Lateral radiograph showed appropriate reduction of the fracture.  The posterior malleolus fracture fragment was then reduced and provisionally pinned. A 2-hole one-third tubular plate from the [employer] small frag set was then used as a buttress plate and was secured proximal and distal fracture line with bicortical 3.5 mm fully-threaded screws.  The posterior malleolus fracture was noted to be appropriately reduced, all on fluoroscopic images.  Attention was then turned to the distal fibula.  The fracture line was irrigated and the fracture was reduced.  A 3.5-mm fully-threaded lag screw was inserted from posterior to anterior across the fracture site. A one-third tubular plate was then contoured to fit the lateral malleolus fracture.  It  was secured proximally with 3 bicortical screws and distally with 2 unicortical screws.  AP and lateral radiographs confirmed appropriate reduction of the fracture.  Attention was then turned to the medial side of the ankle joint where a longitudinal incision was made.  Dissection was carried down through the skin and subcutaneous tissue to the fracture site.  Fracture site was irrigated and the fracture reduced.  It was provisionally held with tenaculum.  Two 4 mm  partially-threaded cannulated screws were inserted across the fracture site and were both noted to have excellent purchase. AP, mortise, and lateral radiographs confirmed appropriate reduction of the fractures and appropriate position and length of all hardware.  Stress examination was then performed under live fluoroscopy, and there was no widening of the medial clear space or ankle mortise.  Both wounds were then irrigated copiously.  Subcutaneous tissues were approximated with Vicryl and Monocryl, and skin incisions were closed with nylon. Sterile dressings were applied, followed by a well-padded short-leg splint.  Tourniquet was released after application of dressings at 87 minutes.  The patient was awakened from anesthesia and transported to the recovery room in stable condition.  FOLLOWUP PLAN:  The patient will be nonweightbearing on the right lower extremity.  She will follow up with me in the office in 2 weeks for suture removal and conversion to a short-leg cast.  RADIOGRAPHS:  AP, lateral, and mortise radiographs of the right ankle were obtained intraoperatively.  These show interval reduction and fixation of the tibial pilon and fibula fractures.  No other acute injuries are noted.     Toni Arthurs, MD     JH/MEDQ  D:  08/11/2016  T:  08/12/2016  Job:  [id number]

## 2016-08-14 ENCOUNTER — Encounter (HOSPITAL_BASED_OUTPATIENT_CLINIC_OR_DEPARTMENT_OTHER): Payer: Self-pay | Admitting: Orthopedic Surgery

## 2016-11-09 DIAGNOSIS — S82291D Other fracture of shaft of right tibia, subsequent encounter for closed fracture with routine healing: Secondary | ICD-10-CM | POA: Diagnosis not present

## 2016-11-09 DIAGNOSIS — S82491D Other fracture of shaft of right fibula, subsequent encounter for closed fracture with routine healing: Secondary | ICD-10-CM | POA: Diagnosis not present

## 2017-02-23 DIAGNOSIS — Z1159 Encounter for screening for other viral diseases: Secondary | ICD-10-CM | POA: Diagnosis not present

## 2018-08-20 ENCOUNTER — Telehealth: Payer: Self-pay | Admitting: Emergency Medicine

## 2018-08-20 NOTE — Telephone Encounter (Signed)
Copied from CRM [id number]. Topic: Appointment Scheduling - New Patient >> Aug 20, 2018  3:51 PM Debroah LoopLander, Lumin L wrote: New patient has been scheduled for your office. Provider: Mardelle MatteAndy Date of Appointment: Date: 09/12/2018 Status: Sch Time:10:45 AM    Route to department's PEC pool.

## 2018-09-11 ENCOUNTER — Encounter: Payer: Self-pay | Admitting: Emergency Medicine

## 2018-09-12 ENCOUNTER — Ambulatory Visit (INDEPENDENT_AMBULATORY_CARE_PROVIDER_SITE_OTHER): Payer: BLUE CROSS/BLUE SHIELD | Admitting: Family Medicine

## 2018-09-12 ENCOUNTER — Encounter: Payer: Self-pay | Admitting: Family Medicine

## 2018-09-12 ENCOUNTER — Other Ambulatory Visit: Payer: Self-pay

## 2018-09-12 VITALS — BP 110/78 | HR 86 | Temp 98.0°F | Ht 62.0 in | Wt 148.4 lb

## 2018-09-12 DIAGNOSIS — K5900 Constipation, unspecified: Secondary | ICD-10-CM | POA: Diagnosis not present

## 2018-09-12 DIAGNOSIS — Z975 Presence of (intrauterine) contraceptive device: Secondary | ICD-10-CM | POA: Diagnosis not present

## 2018-09-12 NOTE — Progress Notes (Signed)
Subjective  CC:  Chief Complaint  Patient presents with  . Establish Care    Trasnfer from [facility], last physical 10/06/2016  . Constipation    has been taking Herbal swiss, three times per day x 1 year    HPI: Carol May is a 32 y.o. female is a former [facility] patient and is here to reestablish care with me today.    She has the following concerns or needs:  History of constipation. Using a senna based herbal laxative daily for almost two years.   Several years back can hard to pass small pellet-like stool.  Over the last 2 years close daily, easily, soft brown formed stool.  Denies abdominal pain.  Denies melena.  Reviewed her diet.:  Protein shake for breakfast, Subway sandwich or salad for lunch, snacking for dinner.  Health maintenance: Due for Pap smear.  Had Nexplanon No. 2 placed in March.  Doing well.  G3, P3.  Currently in a stable monogamous relationship.  Lives with her boyfriend.  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  Likes her job.  Happy.  Assessment  1. Constipation, unspecified constipation type   2. Presence of subcutaneous contraceptive implant      Plan   Constipation: Possible constipation although may be not.  Discussed that it is not necessary to go daily.  Wean off of laxatives.  MiraLAX as needed.  Increase nutrition and diet including grains and fibers to help with digestion and bowel movements.  Follow-up if needed  Follow up:  Return in about 3 months (around 12/13/2018) for complete physical with pap.  Orders Placed This Encounter  Procedures  . HM PAP SMEAR   No orders of the defined types were placed in this encounter.     We updated and reviewed the patient's past history in detail and it is documented below.  Patient Active Problem List   Diagnosis Date Noted  . Presence of subcutaneous contraceptive implant 09/12/2018    March 2019; nexplanon #2   . AR (allergic rhinitis) 04/01/2014   Health Maintenance  Topic Date  Due  . PAP SMEAR  12/05/2017  . TETANUS/TDAP  11/14/2024  . INFLUENZA VACCINE  Completed  . HIV Screening  Completed   Immunization History  Administered Date(s) Administered  . HPV 9-valent 04/09/2015, 06/09/2015  . Hepatitis B, ped/adol 08/29/2000, 10/02/2000, 02/21/2001  . Influenza, Seasonal, Injecte, Preservative Fre 09/22/2016  . Influenza-Unspecified 09/12/2018  . MMR 07/28/1994, 04/11/2003  . PPD Test 02/27/2017  . Tdap 04/22/2012, 09/13/2014, 11/14/2014  . Varicella 04/11/2003   Current Meds  Medication Sig  . UNABLE TO FIND Med Name: Herbal Swiss    Allergies: Patient has No Known Allergies. Past Medical History Patient  has a past medical history of AR (allergic rhinitis) and H/O varicella. Past Surgical History Patient  has a past surgical history that includes Open reduction internal fixation (orif) tibia/fibula fracture (Right, 08/11/2016). Family History: Patient family history includes Asthma in her brother; Healthy in her daughter, son, and son; Hyperlipidemia in her father. Social History:  Patient  reports that she has never smoked. She has never used smokeless tobacco. She reports that she does not drink alcohol or use drugs.  Review of Systems: Constitutional: negative for fever or malaise Ophthalmic: negative for photophobia, double vision or loss of vision Cardiovascular: negative for chest pain, dyspnea on exertion, or new LE swelling Respiratory: negative for SOB or persistent cough Gastrointestinal: negative for abdominal pain, change in bowel habits or melena  Genitourinary: negative for dysuria or gross hematuria Musculoskeletal: negative for new gait disturbance or muscular weakness Integumentary: negative for new or persistent rashes Neurological: negative for TIA or stroke symptoms Psychiatric: negative for SI or delusions Allergic/Immunologic: negative for hives  Patient Care Team    Relationship Specialty Notifications Start End  Leamon Arnt, MD PCP - General Family Medicine  08/20/18     Objective  Vitals: BP 110/78   Pulse 86   Temp 98 F (36.7 C)   Ht _0  (1.575 m)   Wt 148 lb 6.4 oz (67.3 kg)   LMP 08/10/2018   SpO2 99%   BMI 27.14 kg/m  General:  Well developed, well nourished, no acute distress  Psych:  Alert and oriented,normal mood and affect HEENT:  Normocephalic, atraumatic, non-icteric sclera, PERRL, oropharynx is without mass or exudate,  Gastrointestinal: normal bowel sounds, soft, non-tender, no noted masses. No HSM  Commons side effects, risks, benefits, and alternatives for medications and treatment plan prescribed today were discussed, and the patient expressed understanding of the given instructions. Patient is instructed to call or message via MyChart if he/she has any questions or concerns regarding our treatment plan. No barriers to understanding were identified. We discussed Red Flag symptoms and signs in detail. Patient expressed understanding regarding what to do in case of urgent or emergency type symptoms.   Medication list was reconciled, printed and provided to the patient in AVS. Patient instructions and summary information was reviewed with the patient as documented in the AVS. This note was prepared with assistance of Dragon voice recognition software. Occasional wrong-word or sound-a-like substitutions may have occurred due to the inherent limitations of voice recognition software

## 2018-09-12 NOTE — Patient Instructions (Signed)
It was so good seeing you again! Thank you for establishing with my new practice and allowing me to continue caring for you. It means a lot to me.   Please schedule a follow up appointment with me in 1-6 months for your annual complete physical; please come fasting.   Eat more fiber.  Eat daily breakfast bowl with rolled oats.  You may use miralax daily if needed.   Wean off of herbal swiss; can use only as needed.

## 2018-10-24 ENCOUNTER — Ambulatory Visit (INDEPENDENT_AMBULATORY_CARE_PROVIDER_SITE_OTHER): Payer: BLUE CROSS/BLUE SHIELD | Admitting: Family Medicine

## 2018-10-24 ENCOUNTER — Encounter: Payer: Self-pay | Admitting: Family Medicine

## 2018-10-24 ENCOUNTER — Other Ambulatory Visit (HOSPITAL_COMMUNITY)
Admission: RE | Admit: 2018-10-24 | Discharge: 2018-10-24 | Disposition: A | Discharge status: Home / Self Care | Payer: BLUE CROSS/BLUE SHIELD | Source: Ambulatory Visit | Attending: Family Medicine | Admitting: Family Medicine

## 2018-10-24 ENCOUNTER — Other Ambulatory Visit: Payer: Self-pay

## 2018-10-24 VITALS — BP 98/60 | HR 64 | Temp 98.8°F | Ht 62.0 in | Wt 147.6 lb

## 2018-10-24 DIAGNOSIS — Z0000 Encounter for general adult medical examination without abnormal findings: Secondary | ICD-10-CM | POA: Insufficient documentation

## 2018-10-24 DIAGNOSIS — Z124 Encounter for screening for malignant neoplasm of cervix: Secondary | ICD-10-CM

## 2018-10-24 DIAGNOSIS — Z975 Presence of (intrauterine) contraceptive device: Secondary | ICD-10-CM | POA: Diagnosis not present

## 2018-10-24 LAB — CBC WITH DIFFERENTIAL/PLATELET
Basophils Absolute: 0 10*3/uL (ref 0.0–0.1)
Basophils Relative: 0.9 % (ref 0.0–3.0)
Eosinophils Absolute: 0.2 10*3/uL (ref 0.0–0.7)
Eosinophils Relative: 4.1 % (ref 0.0–5.0)
HCT: 40.5 % (ref 36.0–46.0)
Hemoglobin: 13.7 g/dL (ref 12.0–15.0)
Lymphocytes Relative: 26.4 % (ref 12.0–46.0)
Lymphs Abs: 1.2 10*3/uL (ref 0.7–4.0)
MCHC: 33.8 g/dL (ref 30.0–36.0)
MCV: 86.8 fl (ref 78.0–100.0)
Monocytes Absolute: 0.2 10*3/uL (ref 0.1–1.0)
Monocytes Relative: 5.6 % (ref 3.0–12.0)
Neutro Abs: 2.8 10*3/uL (ref 1.4–7.7)
Neutrophils Relative %: 63 % (ref 43.0–77.0)
Platelets: 210 10*3/uL (ref 150.0–400.0)
RBC: 4.67 Mil/uL (ref 3.87–5.11)
RDW: 12.8 % (ref 11.5–15.5)
WBC: 4.4 10*3/uL (ref 4.0–10.5)

## 2018-10-24 LAB — COMPREHENSIVE METABOLIC PANEL
ALT: 12 U/L (ref 0–35)
AST: 20 U/L (ref 0–37)
Albumin: 4.5 g/dL (ref 3.5–5.2)
Alkaline Phosphatase: 52 U/L (ref 39–117)
BUN: 16 mg/dL (ref 6–23)
CO2: 26 mEq/L (ref 19–32)
Calcium: 9.5 mg/dL (ref 8.4–10.5)
Chloride: 105 mEq/L (ref 96–112)
Creatinine, Ser: 0.71 mg/dL (ref 0.40–1.20)
GFR: 102.57 mL/min (ref >60.00)
Glucose, Bld: 83 mg/dL (ref 70–99)
Potassium: 3.7 mEq/L (ref 3.5–5.1)
Sodium: 138 mEq/L (ref 135–145)
Total Bilirubin: 0.6 mg/dL (ref 0.2–1.2)
Total Protein: 7.6 g/dL (ref 6.0–8.3)

## 2018-10-24 LAB — LIPID PANEL
Cholesterol: 202 mg/dL — ABNORMAL HIGH (ref 0–200)
HDL: 61.1 mg/dL
LDL Cholesterol: 127 mg/dL — ABNORMAL HIGH (ref 0–99)
NonHDL: 140.69
Total CHOL/HDL Ratio: 3
Triglycerides: 70 mg/dL (ref 0.0–149.0)
VLDL: 14 mg/dL (ref 0.0–40.0)

## 2018-10-24 NOTE — Progress Notes (Signed)
Subjective  Chief Complaint  Patient presents with  . Annual Exam    doing well, no complaints. Pap today. Patient is fasting     HPI: Carol May is a 32 y.o. female who presents to [facility] at [facility] today for a Female Wellness Visit.   Wellness Visit: annual visit with health maintenance review and exam with Pap   Doing well. Pap today. nexplanon and doing fine. Married.   Assessment  1. Annual physical exam   2. Cervical cancer screening   3. Presence of subcutaneous contraceptive implant      Plan  Female Wellness Visit:  Age appropriate Health Maintenance and Prevention measures were discussed with patient. Included topics are cancer screening recommendations, ways to keep healthy (see AVS) including dietary and exercise recommendations, regular eye and dental care, use of seat belts, and avoidance of moderate alcohol use and tobacco use. Pap today  BMI: discussed patient's BMI and encouraged positive lifestyle modifications to help get to or maintain a target BMI.  HM needs and immunizations were addressed and ordered. See below for orders. See HM and immunization section for updates.  Routine labs and screening tests ordered including cmp, cbc and lipids where appropriate.  Discussed recommendations regarding Vit D and calcium supplementation (see AVS)  Follow up: Return in about 1 year (around 10/25/2019) for complete physical.   Orders Placed This Encounter  Procedures  . CBC with Differential/Platelet  . Comprehensive metabolic panel  . Lipid panel  . HIV Antibody (routine testing w rflx)   No orders of the defined types were placed in this encounter.     Lifestyle: Body mass index is 27 kg/m. Wt Readings from Last 3 Encounters:  10/24/18 147 lb 9.6 oz (67 kg)  09/12/18 148 lb 6.4 oz (67.3 kg)  08/11/16 170 lb (77.1 kg)   Diet: general Exercise: frequently,  Need for contraception: Yes, Nexplanon  Patient Active  Problem List   Diagnosis Date Noted  . Presence of subcutaneous contraceptive implant 09/12/2018    March 2019; nexplanon #2   . AR (allergic rhinitis) 04/01/2014   Health Maintenance  Topic Date Due  . PAP SMEAR  12/05/2017  . TETANUS/TDAP  11/14/2024  . INFLUENZA VACCINE  Completed  . HIV Screening  Completed   Immunization History  Administered Date(s) Administered  . HPV 9-valent 04/09/2015, 06/09/2015  . Hepatitis B, ped/adol 08/29/2000, 10/02/2000, 02/21/2001  . Influenza, Seasonal, Injecte, Preservative Fre 09/22/2016  . Influenza-Unspecified 09/12/2018  . MMR 07/28/1994, 04/11/2003  . PPD Test 02/27/2017  . Tdap 04/22/2012, 09/13/2014, 11/14/2014  . Varicella 04/11/2003   We updated and reviewed the patient's past history in detail and it is documented below. Allergies: Patient has No Known Allergies. Past Medical History Patient  has a past medical history of AR (allergic rhinitis) and H/O varicella. Past Surgical History Patient  has a past surgical history that includes Open reduction internal fixation (orif) tibia/fibula fracture (Right, 08/11/2016). Family History: Patient family history includes Asthma in her brother; Healthy in her daughter, son, and son; Hyperlipidemia in her father. Social History:  Patient  reports that she has never smoked. She has never used smokeless tobacco. She reports that she does not drink alcohol or use drugs.  Review of Systems: Constitutional: negative for fever or malaise Ophthalmic: negative for photophobia, double vision or loss of vision Cardiovascular: negative for chest pain, dyspnea on exertion, or new LE swelling Respiratory: negative for SOB or persistent cough Gastrointestinal: negative for abdominal  pain, change in bowel habits or melena Genitourinary: negative for dysuria or gross hematuria, no abnormal uterine bleeding or disharge Musculoskeletal: negative for new gait disturbance or muscular weakness Integumentary:  negative for new or persistent rashes, no breast lumps Neurological: negative for TIA or stroke symptoms Psychiatric: negative for SI or delusions Allergic/Immunologic: negative for hives Patient Care Team    Relationship Specialty Notifications Start End  Leamon Arnt, MD PCP - General Family Medicine  08/20/18     Objective  Vitals: BP 98/60   Pulse 64   Temp 98.8 F (37.1 C)   Ht 5' 2"  (1.575 m)   Wt 147 lb 9.6 oz (67 kg)   SpO2 99%   BMI 27.00 kg/m  General:  Well developed, well nourished, no acute distress  Psych:  Alert and orientedx3,normal mood and affect HEENT:  Normocephalic, atraumatic, non-icteric sclera, PERRL, oropharynx is clear without mass or exudate, supple neck without adenopathy, mass or thyromegaly Cardiovascular:  Normal S1, S2, RRR without gallop, rub or murmur, nondisplaced PMI Respiratory:  Good breath sounds bilaterally, CTAB with normal respiratory effort Gastrointestinal: normal bowel sounds, soft, non-tender, no noted masses. No HSM MSK: no deformities, contusions. Joints are without erythema or swelling. Spine and CVA region are nontender Skin:  Warm, no rashes or suspicious lesions noted Neurologic:    Mental status is normal. CN 2-11 are normal. Gross motor and sensory exams are normal. Normal gait. No tremor Breast Exam: No mass, skin retraction or nipple discharge is appreciated in either breast. No axillary adenopathy. Fibrocystic changes are noted Pelvic Exam: Normal external genitalia, no vulvar or vaginal lesions present. Clear cervix w/o CMT. Bimanual exam reveals a nontender fundus w/o masses, nl size. No adnexal masses present. No inguinal adenopathy. A PAP smear was performed.    Commons side effects, risks, benefits, and alternatives for medications and treatment plan prescribed today were discussed, and the patient expressed understanding of the given instructions. Patient is instructed to call or message via MyChart if he/she has any  questions or concerns regarding our treatment plan. No barriers to understanding were identified. We discussed Red Flag symptoms and signs in detail. Patient expressed understanding regarding what to do in case of urgent or emergency type symptoms.   Medication list was reconciled, printed and provided to the patient in AVS. Patient instructions and summary information was reviewed with the patient as documented in the AVS. This note was prepared with assistance of Dragon voice recognition software. Occasional wrong-word or sound-a-like substitutions may have occurred due to the inherent limitations of voice recognition software

## 2018-10-24 NOTE — Patient Instructions (Signed)
Please return in 12 months for your annual complete physical; please come fasting.  Happy Holidays!  If you have any questions or concerns, please don't hesitate to send me a message via MyChart or call the office at [phone]. Thank you for visiting with us today! It's our pleasure caring for you.  Please do these things to maintain good health!   Exercise at least 30-45 minutes a day,  4-5 days a week.   Eat a low-fat diet with lots of fruits and vegetables, up to 7-9 servings per day.  Drink plenty of water daily. Try to drink 8 8oz glasses per day.  Seatbelts can save your life. Always wear your seatbelt.  Place Smoke Detectors on every level of your home and check batteries every year.  Schedule an appointment with an eye doctor for an eye exam every 1-2 years  Safe sex - use condoms to protect yourself from STDs if you could be exposed to these types of infections. Use birth control if you do not want to become pregnant and are sexually active.  Avoid heavy alcohol use. If you drink, keep it to less than 2 drinks/day and not every day.  Health Care Power of Attorney.  Choose someone you trust that could speak for you if you became unable to speak for yourself.  Depression is common in our stressful world.If you're feeling down or losing interest in things you normally enjoy, please come in for a visit.  If anyone is threatening or hurting you, please get help. Physical or Emotional Violence is never OK.

## 2018-10-25 LAB — HIV ANTIBODY (ROUTINE TESTING W REFLEX): HIV 1&2 Ab, 4th Generation: NONREACTIVE

## 2018-10-26 LAB — CYTOLOGY - PAP
HPV 16/18/45 genotyping: NEGATIVE
HPV: DETECTED — AB

## 2018-11-09 ENCOUNTER — Other Ambulatory Visit: Payer: Self-pay | Admitting: Family Medicine

## 2018-11-09 DIAGNOSIS — R87619 Unspecified abnormal cytological findings in specimens from cervix uteri: Secondary | ICD-10-CM

## 2018-12-19 DIAGNOSIS — R87619 Unspecified abnormal cytological findings in specimens from cervix uteri: Secondary | ICD-10-CM | POA: Insufficient documentation

## 2018-12-19 DIAGNOSIS — N898 Other specified noninflammatory disorders of vagina: Secondary | ICD-10-CM | POA: Diagnosis not present

## 2018-12-19 DIAGNOSIS — Z3202 Encounter for pregnancy test, result negative: Secondary | ICD-10-CM | POA: Diagnosis not present

## 2018-12-19 DIAGNOSIS — N870 Mild cervical dysplasia: Secondary | ICD-10-CM | POA: Diagnosis not present

## 2018-12-19 DIAGNOSIS — Z113 Encounter for screening for infections with a predominantly sexual mode of transmission: Secondary | ICD-10-CM | POA: Diagnosis not present

## 2018-12-19 DIAGNOSIS — R309 Painful micturition, unspecified: Secondary | ICD-10-CM | POA: Diagnosis not present

## 2018-12-19 DIAGNOSIS — N879 Dysplasia of cervix uteri, unspecified: Secondary | ICD-10-CM | POA: Diagnosis not present

## 2018-12-19 HISTORY — PX: COLPOSCOPY: SHX161

## 2018-12-20 ENCOUNTER — Encounter: Payer: Self-pay | Admitting: *Deleted

## 2019-01-16 DIAGNOSIS — Z3202 Encounter for pregnancy test, result negative: Secondary | ICD-10-CM | POA: Diagnosis not present

## 2019-01-16 DIAGNOSIS — N870 Mild cervical dysplasia: Secondary | ICD-10-CM | POA: Diagnosis not present

## 2019-01-16 DIAGNOSIS — N871 Moderate cervical dysplasia: Secondary | ICD-10-CM | POA: Diagnosis not present

## 2019-01-21 ENCOUNTER — Encounter: Payer: Self-pay | Admitting: Family Medicine

## 2019-01-21 DIAGNOSIS — N879 Dysplasia of cervix uteri, unspecified: Secondary | ICD-10-CM

## 2019-01-21 HISTORY — DX: Dysplasia of cervix uteri, unspecified: N87.9

## 2019-10-30 ENCOUNTER — Encounter: Payer: BLUE CROSS/BLUE SHIELD | Admitting: Family Medicine

## 2019-11-05 ENCOUNTER — Other Ambulatory Visit: Payer: Self-pay

## 2019-11-06 ENCOUNTER — Ambulatory Visit (INDEPENDENT_AMBULATORY_CARE_PROVIDER_SITE_OTHER): Payer: BC Managed Care – PPO | Admitting: Family Medicine

## 2019-11-06 ENCOUNTER — Encounter: Payer: Self-pay | Admitting: Family Medicine

## 2019-11-06 VITALS — BP 110/62 | HR 98 | Temp 97.6°F | Ht 62.0 in | Wt 149.0 lb

## 2019-11-06 DIAGNOSIS — Z0000 Encounter for general adult medical examination without abnormal findings: Secondary | ICD-10-CM | POA: Diagnosis not present

## 2019-11-06 DIAGNOSIS — L659 Nonscarring hair loss, unspecified: Secondary | ICD-10-CM

## 2019-11-06 DIAGNOSIS — G44209 Tension-type headache, unspecified, not intractable: Secondary | ICD-10-CM | POA: Diagnosis not present

## 2019-11-06 DIAGNOSIS — N879 Dysplasia of cervix uteri, unspecified: Secondary | ICD-10-CM | POA: Diagnosis not present

## 2019-11-06 DIAGNOSIS — Z975 Presence of (intrauterine) contraceptive device: Secondary | ICD-10-CM | POA: Diagnosis not present

## 2019-11-06 LAB — LIPID PANEL
Cholesterol: 206 mg/dL — ABNORMAL HIGH (ref 0–200)
HDL: 66.3 mg/dL (ref >39.00)
LDL Cholesterol: 126 mg/dL — ABNORMAL HIGH (ref 0–99)
NonHDL: 139.71
Total CHOL/HDL Ratio: 3
Triglycerides: 68 mg/dL (ref 0.0–149.0)
VLDL: 13.6 mg/dL (ref 0.0–40.0)

## 2019-11-06 LAB — COMPREHENSIVE METABOLIC PANEL
ALT: 17 U/L (ref 0–35)
AST: 23 U/L (ref 0–37)
Albumin: 4.3 g/dL (ref 3.5–5.2)
Alkaline Phosphatase: 54 U/L (ref 39–117)
BUN: 10 mg/dL (ref 6–23)
CO2: 28 mEq/L (ref 19–32)
Calcium: 9.5 mg/dL (ref 8.4–10.5)
Chloride: 105 mEq/L (ref 96–112)
Creatinine, Ser: 0.77 mg/dL (ref 0.40–1.20)
GFR: 87.28 mL/min (ref >60.00)
Glucose, Bld: 106 mg/dL — ABNORMAL HIGH (ref 70–99)
Potassium: 4.2 mEq/L (ref 3.5–5.1)
Sodium: 141 mEq/L (ref 135–145)
Total Bilirubin: 0.4 mg/dL (ref 0.2–1.2)
Total Protein: 7.3 g/dL (ref 6.0–8.3)

## 2019-11-06 LAB — CBC WITH DIFFERENTIAL/PLATELET
Basophils Absolute: 0 10*3/uL (ref 0.0–0.1)
Basophils Relative: 0.7 % (ref 0.0–3.0)
Eosinophils Absolute: 0 10*3/uL (ref 0.0–0.7)
Eosinophils Relative: 0.9 % (ref 0.0–5.0)
HCT: 39.5 % (ref 36.0–46.0)
Hemoglobin: 13.4 g/dL (ref 12.0–15.0)
Lymphocytes Relative: 14.1 % (ref 12.0–46.0)
Lymphs Abs: 0.5 10*3/uL — ABNORMAL LOW (ref 0.7–4.0)
MCHC: 33.8 g/dL (ref 30.0–36.0)
MCV: 85.7 fl (ref 78.0–100.0)
Monocytes Absolute: 0.4 10*3/uL (ref 0.1–1.0)
Monocytes Relative: 11.9 % (ref 3.0–12.0)
Neutro Abs: 2.7 10*3/uL (ref 1.4–7.7)
Neutrophils Relative %: 72.4 % (ref 43.0–77.0)
Platelets: 192 10*3/uL (ref 150.0–400.0)
RBC: 4.61 Mil/uL (ref 3.87–5.11)
RDW: 13.3 % (ref 11.5–15.5)
WBC: 3.8 10*3/uL — ABNORMAL LOW (ref 4.0–10.5)

## 2019-11-06 LAB — TSH: TSH: 0.95 u[IU]/mL (ref 0.35–4.50)

## 2019-11-06 NOTE — Progress Notes (Deleted)
Phone: [phone]   Subjective:  Patient presents today for their annual physical. Chief complaint-noted.   See problem oriented charting- Review of Systems  Constitutional: Negative.   HENT: Negative.   Eyes: Negative.   Respiratory: Positive for cough.   Cardiovascular: Negative.   Gastrointestinal: Negative.   Genitourinary: Negative.   Musculoskeletal: Negative.   Skin: Negative.   Neurological: Positive for headaches (second migraine this week).  Endo/Heme/Allergies: Bruises/bleeds easily.  Psychiatric/Behavioral: Negative.   - full  review of systems was completed and negative   The following were reviewed and entered/updated in epic: Past Medical History:  Diagnosis Date  . AR (allergic rhinitis)   . Cervical dysplasia 01/21/2019   CIN 1-2; s/p LEEP 01/2019 Dr. Sandford Craze  . H/O varicella    Patient Active Problem List   Diagnosis Date Noted  . Cervical dysplasia 01/21/2019  . Presence of subcutaneous contraceptive implant 09/12/2018  . AR (allergic rhinitis) 04/01/2014   Past Surgical History:  Procedure Laterality Date  . COLPOSCOPY  12/19/2018  . OPEN REDUCTION INTERNAL FIXATION (ORIF) TIBIA/FIBULA FRACTURE Right 08/11/2016   Procedure: OPEN REDUCTION INTERNAL FIXATION (ORIF) RIGHT TIBIA/FIBULA  PILON FRACTURE;  Surgeon: Wylene Simmer, MD;  Location: [facility];  Service: Orthopedics;  Laterality: Right;    Family History  Problem Relation Age of Onset  . Hyperlipidemia Father   . Asthma Brother   . Healthy Daughter   . Healthy Son   . Healthy Son   . Healthy Mother     Medications- reviewed and updated Current Outpatient Medications  Medication Sig Dispense Refill  . UNABLE TO FIND Med Name: Herbal Swiss     No current facility-administered medications for this visit.     Allergies-reviewed and updated No Known Allergies  Social History   Social History Narrative  . Not on file   Objective  Objective:  BP 110/62 (BP  Location: Right Arm, Patient Position: Sitting, Cuff Size: Normal)   Pulse 98   Temp 97.6 F (36.4 C) (Temporal)   Ht 5' 2"  (1.575 m)   Wt 149 lb (67.6 kg)   SpO2 98%   BMI 27.25 kg/m  Gen: NAD, resting comfortably HEENT: Mucous membranes are moist. Oropharynx normal Neck: no thyromegaly CV: RRR no murmurs rubs or gallops Lungs: CTAB no crackles, wheeze, rhonchi Abdomen: soft/nontender/nondistended/normal bowel sounds. No rebound or guarding.  Ext: no edema Skin: warm, dry Neuro: grossly normal, moves all extremities, PERRLA***   Assessment and Plan   33 y.o. female presenting for annual physical.  Health Maintenance counseling: 1. Anticipatory guidance: Patient counseled regarding regular dental exams q6 months, eye exams,  avoiding smoking and second hand smoke , limiting alcohol to 1 beverage per day .   2. Risk factor reduction:  Advised patient of need for regular exercise and diet rich and fruits and vegetables to reduce risk of heart attack and stroke. Exercise- walking daily. Diet-eat healthy options.  Wt Readings from Last 3 Encounters:  11/06/19 149 lb (67.6 kg)  10/24/18 147 lb 9.6 oz (67 kg)  09/12/18 148 lb 6.4 oz (67.3 kg)   3. Immunizations/screenings/ancillary studies Immunization History  Administered Date(s) Administered  . HPV 9-valent 04/09/2015, 06/09/2015  . Hepatitis B, ped/adol 08/29/2000, 10/02/2000, 02/21/2001  . Influenza, Seasonal, Injecte, Preservative Fre 09/22/2016  . Influenza-Unspecified 09/12/2018  . MMR 07/28/1994, 04/11/2003  . PPD Test 02/27/2017  . Tdap 04/22/2012, 09/13/2014, 11/14/2014  . Varicella 04/11/2003   Health Maintenance Due  Topic Date Due  . INFLUENZA VACCINE  07/06/2019   4. Cervical cancer screening- Yearly pap smears 5. Breast cancer screening-  breast exam monthy and mammogram- N/A 6. Colon cancer screening - N/A 7. Skin cancer screening- advised regular sunscreen use. Denies worrisome, changing, or new skin  lesions.  8. Birth control/STD check- N/A 9. Osteoporosis screening at 89- N/A -Never smoker  Status of chronic or acute concerns   No specialty comments available. *** No diagnosis found.  Recommended follow up: ***No follow-ups on file. No future appointments.  Chief Complaint  Patient presents with  . Annual Exam   Lab/Order associations: Not fasting No diagnosis found.  No orders of the defined types were placed in this encounter.   Return precautions advised.  Serita Sheller, [place]

## 2019-11-06 NOTE — Progress Notes (Signed)
Subjective  Chief Complaint  Patient presents with  . Annual Exam    Migraines    HPI: Carol May is a 33 y.o. female who presents to [facility] today for a Female Wellness Visit.  She also has the concerns and/or needs as listed above in the chief complaint. These will be addressed in addition to the Health Maintenance Visit.   Wellness Visit: annual visit with health maintenance review and exam without Pap   HM: s/p colpo that showed cin2, then LEEP in 01/2019. Has f/u with GYN. Has nexplanon for birth control.  Chronic disease management visit and/or acute problem visit:  Headaches: unfortunately, she has ended her relationship with the father of her 3 children. She is now living with her parents and the children. She had to go to court last week due to child support payment issues. She is thus stressed. Describes 1-2 weeks of occipital and bifrontal headaches w/o alarm features and unrelieved with tylenol. No nausea, photophobia, or severe sxs. No neuro sxs, visual deficits. Feels fine now. Handling stress well, no sxs of anxiety or depression. Sleeps well. Has help with kids and works full time. No h/o migraines, recurrent headaches or mood disorders.  Hair is thinning x several months w/o rash, scalp lesions or patchy hair loss.   Assessment  1. Annual physical exam   2. Presence of subcutaneous contraceptive implant   3. Cervical dysplasia   4. Tension headache   5. Hair thinning      Plan  Female Wellness Visit:  Age appropriate Health Maintenance and Prevention measures were discussed with patient. Included topics are cancer screening recommendations, ways to keep healthy (see AVS) including dietary and exercise recommendations, regular eye and dental care, use of seat belts, and avoidance of moderate alcohol use and tobacco use. Screens up to date. Pap next year with gyn.   BMI: discussed patient's BMI and encouraged positive lifestyle  modifications to help get to or maintain a target BMI.  HM needs and immunizations were addressed and ordered. See below for orders. See HM and immunization section for updates. utd  Routine labs and screening tests ordered including cmp, cbc and lipids where appropriate.  Discussed recommendations regarding Vit D and calcium supplementation (see AVS)  Chronic disease f/u and/or acute problem visit: (deemed necessary to be done in addition to the wellness visit):  Stress related headaches and hair thinning:  Suspect muscle contraction HA and telogen effluvium. Educated. Change to nsaids and monitor. F/u with me if not improving.  Follow up: Return in about 1 year (around 11/05/2020) for complete physical.   Orders Placed This Encounter  Procedures  . CMP  . CBC w/Diff  . Lipids  . TSH   No orders of the defined types were placed in this encounter.     Lifestyle: Body mass index is 27.25 kg/m. Wt Readings from Last 3 Encounters:  11/06/19 149 lb (67.6 kg)  10/24/18 147 lb 9.6 oz (67 kg)  09/12/18 148 lb 6.4 oz (67.3 kg)     Patient Active Problem List   Diagnosis Date Noted  . Cervical dysplasia 01/21/2019    CIN 1-2; s/p LEEP 01/2019 Dr. Sandford Craze   . Presence of subcutaneous contraceptive implant 09/12/2018    March 2019; nexplanon #2   . AR (allergic rhinitis) 04/01/2014   Health Maintenance  Topic Date Due  . INFLUENZA VACCINE  07/06/2019  . PAP SMEAR-Modifier  10/24/2021  . TETANUS/TDAP  11/14/2024  .  HIV Screening  Completed   Immunization History  Administered Date(s) Administered  . HPV 9-valent 04/09/2015, 06/09/2015  . Hepatitis B, ped/adol 08/29/2000, 10/02/2000, 02/21/2001  . Influenza, Seasonal, Injecte, Preservative Fre 09/22/2016  . Influenza-Unspecified 09/12/2018  . MMR 07/28/1994, 04/11/2003  . PPD Test 02/27/2017  . Tdap 04/22/2012, 09/13/2014, 11/14/2014  . Varicella 04/11/2003   We updated and reviewed the patient's past history  in detail and it is documented below. Allergies: Patient  reports no history of alcohol use. Past Medical History Patient  has a past medical history of AR (allergic rhinitis), Cervical dysplasia (01/21/2019), and H/O varicella. Past Surgical History Patient  has a past surgical history that includes Open reduction internal fixation (orif) tibia/fibula fracture (Right, 08/11/2016) and Colposcopy (12/19/2018). Social History   Socioeconomic History  . Marital status: Single    Spouse name: lives with boyfriend  . Number of children: 3  . Years of education: Not on file  . Highest education level: Not on file  Occupational History  . Occupation: dental hygeinist    Employer: [employer]  . Financial resource strain: Not on file  . Food insecurity    Worry: Not on file    Inability: Not on file  . Transportation needs    Medical: Not on file    Non-medical: Not on file  Tobacco Use  . Smoking status: Never Smoker  . Smokeless tobacco: Never Used  Substance and Sexual Activity  . Alcohol use: No  . Drug use: No  . Sexual activity: Yes    Birth control/protection: Implant  Lifestyle  . Physical activity    Days per week: Not on file    Minutes per session: Not on file  . Stress: Not on file  Relationships  . Social Herbalist on phone: Not on file    Gets together: Not on file    Attends religious service: Not on file    Active member of club or organization: Not on file    Attends meetings of clubs or organizations: Not on file    Relationship status: Not on file  Other Topics Concern  . Not on file  Social History Narrative  . Not on file   Family History  Problem Relation Age of Onset  . Hyperlipidemia Father   . Asthma Brother   . Healthy Daughter   . Healthy Son   . Healthy Son   . Healthy Mother     Review of Systems: Constitutional: negative for fever or malaise Ophthalmic: negative for photophobia, double vision or loss of vision  Cardiovascular: negative for chest pain, dyspnea on exertion, or new LE swelling Respiratory: negative for SOB or persistent cough Gastrointestinal: negative for abdominal pain, change in bowel habits or melena Genitourinary: negative for dysuria or gross hematuria, no abnormal uterine bleeding or disharge Musculoskeletal: negative for new gait disturbance or muscular weakness Integumentary: negative for new or persistent rashes, no breast lumps Neurological: negative for TIA or stroke symptoms Psychiatric: negative for SI or delusions Allergic/Immunologic: negative for hives  Patient Care Team    Relationship Specialty Notifications Start End  Leamon Arnt, MD PCP - General Family Medicine  08/20/18     Objective  Vitals: BP 110/62 (BP Location: Right Arm, Patient Position: Sitting, Cuff Size: Normal)   Pulse 98   Temp 97.6 F (36.4 C) (Temporal)   Ht 5' 2"  (1.575 m)   Wt 149 lb (67.6 kg)   SpO2 98%  BMI 27.25 kg/m  General:  Well developed, well nourished, no acute distress  Psych:  Alert and orientedx3,normal mood and affect HEENT:  Normocephalic, atraumatic, non-icteric sclera, PERRL, oropharynx is clear without mass or exudate, supple neck without adenopathy, mass or thyromegaly Cardiovascular:  Normal S1, S2, RRR without gallop, rub or murmur, nondisplaced PMI Respiratory:  Good breath sounds bilaterally, CTAB with normal respiratory effort Gastrointestinal: normal bowel sounds, soft, non-tender, no noted masses. No HSM MSK: no deformities, contusions. Joints are without erythema or swelling. Spine and CVA region are nontender Skin:  Warm, no rashes or suspicious lesions noted, no patchy hair loss Neurologic:    Mental status is normal. CN 2-11 are normal. Gross motor and sensory exams are normal. Normal gait. No tremor    Commons side effects, risks, benefits, and alternatives for medications and treatment plan prescribed today were discussed, and the patient expressed  understanding of the given instructions. Patient is instructed to call or message via MyChart if he/she has any questions or concerns regarding our treatment plan. No barriers to understanding were identified. We discussed Red Flag symptoms and signs in detail. Patient expressed understanding regarding what to do in case of urgent or emergency type symptoms.   Medication list was reconciled, printed and provided to the patient in AVS. Patient instructions and summary information was reviewed with the patient as documented in the AVS. This note was prepared with assistance of Dragon voice recognition software. Occasional wrong-word or sound-a-like substitutions may have occurred due to the inherent limitations of voice recognition software  This visit occurred during the SARS-CoV-2 public health emergency.  Safety protocols were in place, including screening questions prior to the visit, additional usage of staff PPE, and extensive cleaning of exam room while observing appropriate contact time as indicated for disinfecting solutions.

## 2019-11-06 NOTE — Patient Instructions (Addendum)
Please return in 12 months for your annual complete physical; please come fasting.  I will release your lab results to you on your MyChart account with further instructions. Please reply with any questions.  Try taking ibuprofen 400-600mg  (2-3 tabs) every 8 hours as needed for your headache. It should help. Take care of yourself. Let me know if you need anything.   If you have any questions or concerns, please don't hesitate to send me a message via MyChart or call the office at [phone]. Thank you for visiting with Carol May today! It's our pleasure caring for you.   Tension Headache, Adult A tension headache is a feeling of pain, pressure, or aching in the head that is often felt over the front and sides of the head. The pain can be dull, or it can feel tight (constricting). There are two types of tension headache:  Episodic tension headache. This is when the headaches happen fewer than 15 days a month.  Chronic tension headache. This is when the headaches happen more than 15 days a month during a 54-month period. A tension headache can last from 30 minutes to several days. It is the most common kind of headache. Tension headaches are not normally associated with nausea or vomiting, and they do not get worse with physical activity. What are the causes? The exact cause of this condition is not known. Tension headaches are often triggered by stress, anxiety, or depression. Other triggers include:  Alcohol.  Too much caffeine or caffeine withdrawal.  Respiratory infections, such as colds, flu, or sinus infections.  Dental problems or teeth clenching.  Tiredness (fatigue).  Holding your head and neck in the same position for a long period of time, such as while using a computer.  Smoking.  Arthritis of the neck. What are the signs or symptoms? Symptoms of this condition include:  A feeling of pressure or tightness around the head.  Dull, aching head pain.  Pain over the front and  sides of the head.  Tenderness in the muscles of the head, neck, and shoulders. How is this diagnosed? This condition may be diagnosed based on your symptoms, your medical history, and a physical exam. If your symptoms are severe or unusual, you may have imaging tests, such as a CT scan or an MRI of your head. Your vision may also be checked. How is this treated? This condition may be treated with lifestyle changes and with medicines that help relieve symptoms. Follow these instructions at home: Managing pain  Take over-the-counter and prescription medicines only as told by your health care provider.  When you have a headache, lie down in a dark, quiet room.  If directed, apply ice to the head and neck: ? Put ice in a plastic bag. ? Place a towel between your skin and the bag. ? Leave the ice on for 20 minutes, 2-3 times a day.  If directed, apply heat to the back of your neck as often as told by your health care provider. Use the heat source that your health care provider recommends, such as a moist heat pack or a heating pad. ? Place a towel between your skin and the heat source. ? Leave the heat on for 20-30 minutes. ? Remove the heat if your skin turns bright red. This is especially important if you are unable to feel pain, heat, or cold. You may have a greater risk of getting burned. Eating and drinking  Eat meals on a regular schedule.  Limit  alcohol intake to no more than 1 drink a day for nonpregnant women and 2 drinks a day for men. One drink equals 12 oz of beer, 5 oz of wine, or 1 oz of hard liquor.  Drink enough fluid to keep your urine pale yellow.  Decrease your caffeine intake, or stop using caffeine. Lifestyle  Get 7-9 hours of sleep each night, or get the amount of sleep recommended by your health care provider.  At bedtime, remove all electronic devices from your room. Electronic devices include computers, phones, and tablets.  Find ways to manage your stress.  Some things that can help relieve stress include: ? Exercise. ? Deep breathing exercises. ? Yoga. ? Listening to music. ? Positive mental imagery.  Try to sit up straight and avoid tensing your muscles.  Do not use any products that contain nicotine or tobacco, such as cigarettes and e-cigarettes. If you need help quitting, ask your health care provider. General instructions   Keep all follow-up visits as told by your health care provider. This is important.  Avoid any headache triggers. Keep a headache journal to help find out what may trigger your headaches. For example, write down: ? What you eat and drink. ? How much sleep you get. ? Any change to your diet or medicines. Contact a health care provider if:  Your headache does not get better.  Your headache comes back.  You are sensitive to sounds, light, or smells because of a headache.  You have nausea or you vomit.  Your stomach hurts. Get help right away if:  You suddenly develop a very severe headache along with any of the following: ? A stiff neck. ? Nausea and vomiting. ? Confusion. ? Weakness. ? Double vision or loss of vision. ? Shortness of breath. ? Rash. ? Unusual sleepiness. ? Fever. ? Trouble speaking. ? Pain in your eyes or ears. ? Trouble walking or balancing. ? Feeling faint or passing out. Summary  A tension headache is a feeling of pain, pressure, or aching in the head that is often felt over the front and sides of the head.  A tension headache can last from 30 minutes to several days. It is the most common kind of headache.  This condition may be diagnosed based on your symptoms, your medical history, and a physical exam.  This condition may be treated with lifestyle changes and with medicines that help relieve symptoms. This information is not intended to replace advice given to you by your health care provider. Make sure you discuss any questions you have with your health care  provider. Document Released: 11/21/2005 Document Revised: 11/03/2017 Document Reviewed: 03/03/2017 Elsevier Patient Education  2020 [employer].

## 2020-11-11 ENCOUNTER — Encounter: Payer: BC Managed Care – PPO | Admitting: Family Medicine

## 2020-11-18 ENCOUNTER — Encounter: Payer: BC Managed Care – PPO | Admitting: Family Medicine

## 2020-12-09 ENCOUNTER — Other Ambulatory Visit: Payer: Self-pay

## 2020-12-09 ENCOUNTER — Telehealth (INDEPENDENT_AMBULATORY_CARE_PROVIDER_SITE_OTHER): Payer: Medicaid Other | Admitting: Family Medicine

## 2020-12-09 ENCOUNTER — Encounter: Payer: Self-pay | Admitting: Family Medicine

## 2020-12-09 DIAGNOSIS — J029 Acute pharyngitis, unspecified: Secondary | ICD-10-CM

## 2020-12-09 DIAGNOSIS — G8929 Other chronic pain: Secondary | ICD-10-CM

## 2020-12-09 DIAGNOSIS — J301 Allergic rhinitis due to pollen: Secondary | ICD-10-CM

## 2020-12-09 DIAGNOSIS — Z7185 Encounter for immunization safety counseling: Secondary | ICD-10-CM

## 2020-12-09 DIAGNOSIS — M25562 Pain in left knee: Secondary | ICD-10-CM

## 2020-12-09 DIAGNOSIS — M25561 Pain in right knee: Secondary | ICD-10-CM

## 2020-12-09 NOTE — Progress Notes (Signed)
Virtual Visit via Video Note  Subjective  CC:  Chief Complaint  Patient presents with  . Sore Throat    COVID test negative, symptoms started two weeks ago  . Knee Pain    Bilateral, more trouble with right knee     I connected with Legrand Rams on 12/09/20 at  2:00 PM EST by a video enabled telemedicine application and verified that I am speaking with the correct person using two identifiers. Location patient: Home Location provider: [facility] [address] Office Persons participating in the virtual visit: Rainee Sweatt, Willow Ora, MD Adela Glimpse CMA  I discussed the limitations of evaluation and management by telemedicine and the availability of in person appointments. The patient expressed understanding and agreed to proceed. HPI: Carol May is a 35 y.o. female who was contacted today to address the problems listed above in the chief complaint.  35 year old with history of allergies complains of 2-week history of sore throat.  Started with significant sore throat.  However symptoms are very mild now.  Her throat is worse in the morning.  Admits to clear postnasal drainage.  And mild cough.  No fevers, chills, change in taste or smell, shortness of breath, chest pain, or GI symptoms.  No myalgias.  She just received a negative Covid test today.  She is unvaccinated.  She does not feel sick.  No fatigue.  She is taking some Sudafed.  Nothing else.  Knee pain: Started about a year ago.  Now worsening.  Reports more cracking when she was up and down stairs with pain while going downstairs.  She does weight training and has some pain with deep squats.  No locking or give way symptoms.  She admits that occasionally it will swell.  No injuries.  This has not been evaluated before.  She does not use any medications for pain.  She has used some topical creams.  IcyHot.  Some icing.  Assessment  1. Sore throat   2. Seasonal allergic rhinitis due to pollen    3. Chronic pain of both knees   4. Vaccine counseling      Plan   Sore throat and allergies: She may be recovering from a viral pharyngitis.  Reassured.  Symptoms could also be related to allergies.  Start antihistamine.  Advil if needed.  Follow-up if unimproved.  Covid vaccine counseling: Encouraged patient to get vaccinated  Knee pain: Difficult for video visit.  Recommend returning to office for examination.  In the meantime icing and 1 to 2-week course of Advil and refraining from deep squats for 2 weeks.  If the symptoms are not improving with this course of treatment, she will return for examination.  Differential diagnosis includes patellofemoral syndrome, knee strain, bursitis or other. I discussed the assessment and treatment plan with the patient. The patient was provided an opportunity to ask questions and all were answered. The patient agreed with the plan and demonstrated an understanding of the instructions.   The patient was advised to call back or seek an in-person evaluation if the symptoms worsen or if the condition fails to improve as anticipated. Follow up: For CPE 03/10/2021  No orders of the defined types were placed in this encounter.     I reviewed the patients updated PMH, FH, and SocHx.    Patient Active Problem List   Diagnosis Date Noted  . Cervical dysplasia 01/21/2019  . Presence of subcutaneous contraceptive implant 09/12/2018  . AR (  allergic rhinitis) 04/01/2014   No outpatient medications have been marked as taking for the 12/09/20 encounter (Video Visit) with Willow Ora, MD.    Allergies: Patient has No Known Allergies. Family History: Patient family history includes Asthma in her brother; Healthy in her daughter, mother, son, and son; Hyperlipidemia in her father. Social History:  Patient  reports that she has never smoked. She has never used smokeless tobacco. She reports that she does not drink alcohol and does not use drugs.  Review of  Systems: Constitutional: Negative for fever malaise or anorexia Cardiovascular: negative for chest pain Respiratory: negative for SOB or persistent cough Gastrointestinal: negative for abdominal pain  OBJECTIVE Vitals: There were no vitals taken for this visit. General: no acute distress , A&Ox3, appears well, no hoarseness, no respiratory distress  Willow Ora, MD

## 2021-02-15 ENCOUNTER — Telehealth: Payer: Self-pay

## 2021-02-15 NOTE — Telephone Encounter (Signed)
error 

## 2021-02-16 ENCOUNTER — Encounter: Payer: Self-pay | Admitting: Family Medicine

## 2021-02-16 ENCOUNTER — Encounter: Payer: Medicaid Other | Admitting: Family Medicine

## 2021-02-16 ENCOUNTER — Other Ambulatory Visit: Payer: Self-pay

## 2021-02-16 NOTE — Progress Notes (Signed)
This encounter was created in error - please disregard.

## 2021-03-10 ENCOUNTER — Encounter: Payer: Self-pay | Admitting: Family Medicine

## 2021-03-10 ENCOUNTER — Other Ambulatory Visit: Payer: Self-pay

## 2021-03-10 ENCOUNTER — Ambulatory Visit (INDEPENDENT_AMBULATORY_CARE_PROVIDER_SITE_OTHER): Payer: 59

## 2021-03-10 ENCOUNTER — Ambulatory Visit (INDEPENDENT_AMBULATORY_CARE_PROVIDER_SITE_OTHER): Payer: 59 | Admitting: Family Medicine

## 2021-03-10 VITALS — BP 106/70 | HR 78 | Temp 98.2°F | Resp 16 | Ht 62.0 in | Wt 159.6 lb

## 2021-03-10 DIAGNOSIS — Z0001 Encounter for general adult medical examination with abnormal findings: Secondary | ICD-10-CM

## 2021-03-10 DIAGNOSIS — M25561 Pain in right knee: Secondary | ICD-10-CM | POA: Diagnosis not present

## 2021-03-10 DIAGNOSIS — N879 Dysplasia of cervix uteri, unspecified: Secondary | ICD-10-CM | POA: Diagnosis not present

## 2021-03-10 DIAGNOSIS — G8929 Other chronic pain: Secondary | ICD-10-CM | POA: Diagnosis not present

## 2021-03-10 DIAGNOSIS — Z975 Presence of (intrauterine) contraceptive device: Secondary | ICD-10-CM

## 2021-03-10 DIAGNOSIS — F4323 Adjustment disorder with mixed anxiety and depressed mood: Secondary | ICD-10-CM

## 2021-03-10 DIAGNOSIS — J301 Allergic rhinitis due to pollen: Secondary | ICD-10-CM

## 2021-03-10 LAB — COMPREHENSIVE METABOLIC PANEL
ALT: 15 U/L (ref 0–35)
AST: 22 U/L (ref 0–37)
Albumin: 4.2 g/dL (ref 3.5–5.2)
Alkaline Phosphatase: 51 U/L (ref 39–117)
BUN: 13 mg/dL (ref 6–23)
CO2: 27 mEq/L (ref 19–32)
Calcium: 9.3 mg/dL (ref 8.4–10.5)
Chloride: 105 mEq/L (ref 96–112)
Creatinine, Ser: 0.71 mg/dL (ref 0.40–1.20)
GFR: 112.35 mL/min (ref >60.00)
Glucose, Bld: 91 mg/dL (ref 70–99)
Potassium: 4.2 mEq/L (ref 3.5–5.1)
Sodium: 138 mEq/L (ref 135–145)
Total Bilirubin: 0.4 mg/dL (ref 0.2–1.2)
Total Protein: 6.9 g/dL (ref 6.0–8.3)

## 2021-03-10 LAB — CBC WITH DIFFERENTIAL/PLATELET
Basophils Absolute: 0 10*3/uL (ref 0.0–0.1)
Basophils Relative: 0.5 % (ref 0.0–3.0)
Eosinophils Absolute: 0.2 10*3/uL (ref 0.0–0.7)
Eosinophils Relative: 4 % (ref 0.0–5.0)
HCT: 40.1 % (ref 36.0–46.0)
Hemoglobin: 13.2 g/dL (ref 12.0–15.0)
Lymphocytes Relative: 26.2 % (ref 12.0–46.0)
Lymphs Abs: 1.6 10*3/uL (ref 0.7–4.0)
MCHC: 32.8 g/dL (ref 30.0–36.0)
MCV: 85.9 fl (ref 78.0–100.0)
Monocytes Absolute: 0.4 10*3/uL (ref 0.1–1.0)
Monocytes Relative: 6.3 % (ref 3.0–12.0)
Neutro Abs: 3.8 10*3/uL (ref 1.4–7.7)
Neutrophils Relative %: 63 % (ref 43.0–77.0)
Platelets: 240 10*3/uL (ref 150.0–400.0)
RBC: 4.67 Mil/uL (ref 3.87–5.11)
RDW: 13.5 % (ref 11.5–15.5)
WBC: 6.1 10*3/uL (ref 4.0–10.5)

## 2021-03-10 LAB — LIPID PANEL
Cholesterol: 189 mg/dL (ref 0–200)
HDL: 61.1 mg/dL (ref >39.00)
LDL Cholesterol: 113 mg/dL — ABNORMAL HIGH (ref 0–99)
NonHDL: 127.71
Total CHOL/HDL Ratio: 3
Triglycerides: 75 mg/dL (ref 0.0–149.0)
VLDL: 15 mg/dL (ref 0.0–40.0)

## 2021-03-10 LAB — TSH: TSH: 1.19 u[IU]/mL (ref 0.35–4.50)

## 2021-03-10 MED ORDER — ESCITALOPRAM OXALATE 10 MG PO TABS
10.0000 mg | ORAL_TABLET | Freq: Every day | ORAL | 2 refills | Status: DC
Start: 2021-03-10 — End: 2021-04-02

## 2021-03-10 NOTE — Patient Instructions (Addendum)
Please return in 6 weeks for mood recheck.   I will release your lab results to you on your MyChart account with further instructions. Please reply with any questions.   We will call you to get you set up with Sports medicine.   Please consider getting set up for therapy.   If you have any questions or concerns, please don't hesitate to send me a message via MyChart or call the office at [phone]. Thank you for visiting with [place] today! It's our pleasure caring for you.  Starting Depression/anxiety Medications:  Taking the medicine as directed and not missing any doses is one of the best things you can do to treat your depression.  Here are some things to keep in mind:  1) Side effects (stomach upset, some increased anxiety) may happen before you notice a benefit.  These side effects typically go away over time. 2) Changes to your dose of medicine or a change in medication all together is sometimes necessary 3) Most people need to be on medication at least 6-12 months 4) Many people will notice an improvement within two weeks but the full effect of the medication can take up to 4-6 weeks 5) Stopping the medication when you start feeling better often results in a return of symptoms 6) If you start having thoughts of hurting yourself or others after starting this medicine, please call the office immediately at [phone].     Stress, Adult Stress is a normal reaction to life events. Stress is what you feel when life demands more than you are used to, or more than you think you can handle. Some stress can be useful, such as studying for a test or meeting a deadline at work. Stress that occurs too often or for too long can cause problems. It can affect your emotional health and interfere with relationships and normal daily activities. Too much stress can weaken your body's defense system (immune system) and increase your risk for physical illness. If you already have a medical problem, stress  can make it worse. What are the causes? All sorts of life events can cause stress. An event that causes stress for one person may not be stressful for another person. Major life events, whether positive or negative, commonly cause stress. Examples include:  Losing a job or starting a new job.  Losing a loved one.  Moving to a new town or home.  Getting married or divorced.  Having a baby.  Getting injured or sick. Less obvious life events can also cause stress, especially if they occur day after day or in combination with each other. Examples include:  Working long hours.  Driving in traffic.  Caring for children.  Being in debt.  Being in a difficult relationship. What are the signs or symptoms? Stress can cause emotional symptoms, including:  Anxiety. This is feeling worried, afraid, on edge, overwhelmed, or out of control.  Anger, including irritation or impatience.  Depression. This is feeling sad, down, helpless, or guilty.  Trouble focusing, remembering, or making decisions. Stress can cause physical symptoms, including:  Aches and pains. These may affect your head, neck, back, stomach, or other areas of your body.  Tight muscles or a clenched jaw.  Low energy.  Trouble sleeping. Stress can cause unhealthy behaviors, including:  Eating to feel better (overeating) or skipping meals.  Working too much or putting off tasks.  Smoking, drinking alcohol, or using drugs to feel better. How is this diagnosed? Stress is diagnosed through  an assessment by your health care provider. He or she may diagnose this condition based on:  Your symptoms and any stressful life events.  Your medical history.  Tests to rule out other causes of your symptoms. Depending on your condition, your health care provider may refer you to a specialist for further evaluation. How is this treated? Stress management techniques are the recommended treatment for stress. Medicine is not  typically recommended for the treatment of stress. Techniques to reduce your reaction to stressful life events include:  Stress identification. Monitor yourself for symptoms of stress and identify what causes stress for you. These skills may help you to avoid or prepare for stressful events.  Time management. Set your priorities, keep a calendar of events, and learn to say no. Taking these actions can help you avoid making too many commitments. Techniques for coping with stress include:  Rethinking the problem. Try to think realistically about stressful events rather than ignoring them or overreacting. Try to find the positives in a stressful situation rather than focusing on the negatives.  Exercise. Physical exercise can release both physical and emotional tension. The key is to find a form of exercise that you enjoy and do it regularly.  Relaxation techniques. These relax the body and mind. The key is to find one or more that you enjoy and use the techniques regularly. Examples include: ? Meditation, deep breathing, or progressive relaxation techniques. ? Yoga or tai chi. ? Biofeedback, mindfulness techniques, or journaling. ? Listening to music, being out in nature, or participating in other hobbies.  Practicing a healthy lifestyle. Eat a balanced diet, drink plenty of water, limit or avoid caffeine, and get plenty of sleep.  Having a strong support network. Spend time with family, friends, or other people you enjoy being around. Express your feelings and talk things over with someone you trust. Counseling or talk therapy with a mental health professional may be helpful if you are having trouble managing stress on your own.   Follow these instructions at home: Lifestyle  Avoid drugs.  Do not use any products that contain nicotine or tobacco, such as cigarettes, e-cigarettes, and chewing tobacco. If you need help quitting, ask your health care provider.  Limit alcohol intake to no  more than 1 drink a day for nonpregnant women and 2 drinks a day for men. One drink equals 12 oz of beer, 5 oz of wine, or 1 oz of hard liquor  Do not use alcohol or drugs to relax.  Eat a balanced diet that includes fresh fruits and vegetables, whole grains, lean meats, fish, eggs, and beans, and low-fat dairy. Avoid processed foods and foods high in added fat, sugar, and salt.  Exercise at least 30 minutes on 5 or more days each week.  Get 7-8 hours of sleep each night.   General instructions  Practice stress management techniques as discussed with your health care provider.  Drink enough fluid to keep your urine clear or pale yellow.  Take over-the-counter and prescription medicines only as told by your health care provider.  Keep all follow-up visits as told by your health care provider. This is important.   Contact a health care provider if:  Your symptoms get worse.  You have new symptoms.  You feel overwhelmed by your problems and can no longer manage them on your own. Get help right away if:  You have thoughts of hurting yourself or others. If you ever feel like you may hurt yourself or others,  or have thoughts about taking your own life, get help right away. You can go to your nearest emergency department or call:  Your local emergency services (911 in the [place]  A suicide crisis helpline, such as the [facility] at 534-881-5253. This is open 24 hours a day. Summary  Stress is a normal reaction to life events. It can cause problems if it happens too often or for too long.  Practicing stress management techniques is the best way to treat stress.  Counseling or talk therapy with a mental health professional may be helpful if you are having trouble managing stress on your own. This information is not intended to replace advice given to you by your health care provider. Make sure you discuss any questions you have with your health care  provider. Document Revised: 08/07/2020 Document Reviewed: 08/07/2020 Elsevier Patient Education  2021 [employer].

## 2021-03-10 NOTE — Progress Notes (Signed)
Subjective  Chief Complaint  Patient presents with  . Annual Exam    Fasting   . concentration    Difficulty focusing, issues with work   . Knee Pain    Right knee, has cut back on lifting at the gym. Does hear/ feel a popping with some knee movement     HPI: Carol May is a 35 y.o. female who presents to [facility] [place] today for a Female Wellness Visit.  She also has the concerns and/or needs as listed above in the chief complaint. These will be addressed in addition to the Health Maintenance Visit.   Wellness Visit: annual visit with health maintenance review and exam without Pap   HM: 35 year old separated female mother of 3 children who works full-time as a Copywriter, advertising for physical exam.  She recently saw her gynecologist for Nexplanon replacement.  She is due for her female wellness exam and Pap smear and will get that scheduled.  She does have history of cervical dysplasia status post LEEP.  She is not vaccinated against COVID Chronic disease management visit and/or acute problem visit:  Right knee pain for 3 to 4 months: Had video visit in January.  At that time complained of pain with squatting in the right knee.  She had been exercising.  She denies injury.  She decreased the amount of exercise but has persistent pain.  Complains of pain if she tries to stand on her right leg only, pain with squats, pain with running, intermittent swelling and at times feels weak.  She does use NSAIDs and any support with mild improvement in symptoms.  No locking.  No giving way.  No history of knee pain prior to January.  Complains of not feeling well overall.  Lack of motivation, poor focus at work, increased stress.  No history of mood disorders.  No mania or panic symptoms.  No family history of mood disorders.-Subsequently more, more apathetic and struggles just to go to work and take care of her children.  Seasonal allergies: Currently stable  Birth  control: Nexplanon, third without complication.  Depression screen [facility] 2/9 03/10/2021 11/06/2019 09/12/2018  Decreased Interest 3 0 0  Down, Depressed, Hopeless 0 0 0  PHQ - 2 Score 3 0 0  Altered sleeping 1 0 -  Tired, decreased energy 0 0 -  Change in appetite 0 0 -  Feeling bad or failure about yourself  0 0 -  Trouble concentrating 3 0 -  Moving slowly or fidgety/restless 0 0 -  Suicidal thoughts 0 0 -  PHQ-9 Score 7 0 -  Difficult doing work/chores Somewhat difficult Not difficult at all -   GAD 7 : Generalized Anxiety Score 03/10/2021  Nervous, Anxious, on Edge 3  Control/stop worrying 3  Worry too much - different things 3  Trouble relaxing 2  Restless 0  Easily annoyed or irritable 3  Afraid - awful might happen 3  Total GAD 7 Score 17  Anxiety Difficulty Extremely difficult     Assessment  1. Annual visit for general adult medical examination with abnormal findings   2. Presence of subcutaneous contraceptive implant   3. Seasonal allergic rhinitis due to pollen   4. Chronic pain of right knee   5. Cervical dysplasia   6. Adjustment disorder with mixed anxiety and depressed mood      Plan  Female Wellness Visit:  Age appropriate Health Maintenance and Prevention measures were discussed with patient. Included topics  are cancer screening recommendations, ways to keep healthy (see AVS) including dietary and exercise recommendations, regular eye and dental care, use of seat belts, and avoidance of moderate alcohol use and tobacco use.  Due Pap smear she will get that scheduled with her neurologist  BMI: discussed patient's BMI and encouraged positive lifestyle modifications to help get to or maintain a target BMI.  HM needs and immunizations were addressed and ordered. See below for orders. See HM and immunization section for updates.  COVID vaccine counseling done  Routine labs and screening tests ordered including cmp, cbc and lipids where appropriate.  Discussed  recommendations regarding Vit D and calcium supplementation (see AVS)  Chronic disease f/u and/or acute problem visit: (deemed necessary to be done in addition to the wellness visit):  Right knee pain: Question internal derangement versus patellofemoral syndrome or other.  Refer to sports medicine and check x-rays.  Seasonal allergies: Continue as needed oral and histamines  Adjustment disorder with mixed anxiety and depressed mood: Extensive counseling done.  Recommend psychotherapy, Lexapro 10 mg daily and close follow-up.  Discussed risk and benefits of medications.  Etiologies of symptoms.  Prognosis.  See after visit summary for more education. Medical decision making for this visit included multiple acute problems, one with uncertain prognosis, counseling and prescription medications.  Review of x-rays, outside specialist chart review from birth control medications follow-up Pap smears.  Follow up: Return in about 6 weeks (around 04/21/2021) for mood follow up.   Orders Placed This Encounter  Procedures  . DG Knee AP/LAT W/Sunrise Right  . CBC with Differential/Platelet  . Comprehensive metabolic panel  . Hepatitis C antibody  . Lipid panel  . TSH  . Ambulatory referral to Sports Medicine   Meds ordered this encounter  Medications  . escitalopram (LEXAPRO) 10 MG tablet    Sig: Take 1 tablet (10 mg total) by mouth daily.    Dispense:  30 tablet    Refill:  2       Body mass index is 29.19 kg/m. Wt Readings from Last 3 Encounters:  03/10/21 159 lb 9.6 oz (72.4 kg)  11/06/19 149 lb (67.6 kg)  10/24/18 147 lb 9.6 oz (67 kg)     Patient Active Problem List   Diagnosis Date Noted  . Adjustment disorder with mixed anxiety and depressed mood 03/10/2021  . Cervical dysplasia 01/21/2019    CIN 1-2; s/p LEEP 01/2019 Dr. Sandford Craze   . Presence of subcutaneous contraceptive implant 09/12/2018    March 2022; nexplanon #3, Dr. Melba Coon   . AR (allergic rhinitis) 04/01/2014    Health Maintenance  Topic Date Due  . Hepatitis C Screening  Never done  . COVID-19 Vaccine (1) 03/26/2021 (Originally 07/28/1993)  . INFLUENZA VACCINE  07/05/2021  . PAP SMEAR-Modifier  10/24/2021  . TETANUS/TDAP  11/14/2024  . HIV Screening  Completed  . HPV VACCINES  Aged Out   Immunization History  Administered Date(s) Administered  . HPV 9-valent 04/09/2015, 06/09/2015  . Hepatitis B, ped/adol 08/29/2000, 10/02/2000, 02/21/2001  . Influenza, Seasonal, Injecte, Preservative Fre 09/22/2016  . Influenza-Unspecified 09/12/2018, 11/05/2020  . MMR 07/28/1994, 04/11/2003  . PPD Test 02/27/2017  . Tdap 04/22/2012, 09/13/2014, 11/14/2014  . Varicella 04/11/2003   We updated and reviewed the patient's past history in detail and it is documented below. Allergies: Patient  reports no history of alcohol use. Past Medical History Patient  has a past medical history of AR (allergic rhinitis), Cervical dysplasia (01/21/2019), and H/O varicella. Past Surgical  History Patient  has a past surgical history that includes Open reduction internal fixation (orif) tibia/fibula fracture (Right, 08/11/2016) and Colposcopy (12/19/2018). Social History   Socioeconomic History  . Marital status: Single    Spouse name: lives with boyfriend  . Number of children: 3  . Years of education: Not on file  . Highest education level: Not on file  Occupational History  . Occupation: dental hygeinist    Employer: [employer]  Tobacco Use  . Smoking status: Never Smoker  . Smokeless tobacco: Never Used  Vaping Use  . Vaping Use: Never used  Substance and Sexual Activity  . Alcohol use: No  . Drug use: No  . Sexual activity: Yes    Birth control/protection: Implant  Other Topics Concern  . Not on file  Social History Narrative  . Not on file   Social Determinants of Health   Financial Resource Strain: Not on file  Food Insecurity: Not on file  Transportation Needs: Not on file  Physical Activity:  Not on file  Stress: Not on file  Social Connections: Not on file   Family History  Problem Relation Age of Onset  . Hyperlipidemia Father   . Asthma Brother   . Healthy Daughter   . Healthy Son   . Healthy Son   . Healthy Mother     Review of Systems: Constitutional: negative for fever or malaise Ophthalmic: negative for photophobia, double vision or loss of vision Cardiovascular: negative for chest pain, dyspnea on exertion, or new LE swelling Respiratory: negative for SOB or persistent cough Gastrointestinal: negative for abdominal pain, change in bowel habits or melena Genitourinary: negative for dysuria or gross hematuria, no abnormal uterine bleeding or disharge Musculoskeletal: negative for new gait disturbance or muscular weakness Integumentary: negative for new or persistent rashes, no breast lumps Neurological: negative for TIA or stroke symptoms Psychiatric: negative for SI or delusions Allergic/Immunologic: negative for hives  Patient Care Team    Relationship Specialty Notifications Start End  Leamon Arnt, MD PCP - General Family Medicine  08/20/18   Janyth Contes, MD Consulting Physician Obstetrics and Gynecology  03/10/21     Objective  Vitals: BP 106/70   Pulse 78   Temp 98.2 F (36.8 C) (Temporal)   Resp 16   Ht 5' 2" (1.575 m)   Wt 159 lb 9.6 oz (72.4 kg)   LMP 02/11/2021   SpO2 97%   BMI 29.19 kg/m  General:  Well developed, well nourished, no acute distress  Psych:  Alert and orientedx3, mildly flat mood and affect, normal cognition HEENT:  Normocephalic, atraumatic, non-icteric sclera, PERRL, supple neck without adenopathy, mass or thyromegaly Cardiovascular:  Normal S1, S2, RRR without gallop, rub or murmur Respiratory:  Good breath sounds bilaterally, CTAB with normal respiratory effort Gastrointestinal: normal bowel sounds, soft, non-tender, no noted masses. No HSM MSK: no deformities, contusions. Joints are without erythema or  swelling.  Right knee: Crepitus, negative apprehension test, full range of motion, negative Lachman's, negative McMurray's Skin:  Warm, no rashes or suspicious lesions noted Neurologic:    Mental status is normal. Gross motor and sensory exams are normal. Normal gait. No tremor     Commons side effects, risks, benefits, and alternatives for medications and treatment plan prescribed today were discussed, and the patient expressed understanding of the given instructions. Patient is instructed to call or message via MyChart if he/she has any questions or concerns regarding our treatment plan. No barriers to understanding were identified. We  discussed Red Flag symptoms and signs in detail. Patient expressed understanding regarding what to do in case of urgent or emergency type symptoms.   Medication list was reconciled, printed and provided to the patient in AVS. Patient instructions and summary information was reviewed with the patient as documented in the AVS. This note was prepared with assistance of Dragon voice recognition software. Occasional wrong-word or sound-a-like substitutions may have occurred due to the inherent limitations of voice recognition software  This visit occurred during the SARS-CoV-2 public health emergency.  Safety protocols were in place, including screening questions prior to the visit, additional usage of staff PPE, and extensive cleaning of exam room while observing appropriate contact time as indicated for disinfecting solutions.

## 2021-03-11 LAB — HEPATITIS C ANTIBODY
Hepatitis C Ab: NONREACTIVE
SIGNAL TO CUT-OFF: 0.01 (ref <1.00)

## 2021-03-30 NOTE — Progress Notes (Signed)
Subjective:    I'm seeing this patient as a consultation for:  Dr. Mardelle Matte. Note will be routed back to referring provider/PCP.  CC: R knee pain  I, Molly Weber, LAT, ATC, am serving as scribe for Dr. [name].  HPI: Pt is a 35 y/o female presenting w/ R knee pain since Jan 2022 w/ no known MOI.  She locates her pain to the anterior aspect of R knee.  She has seen her PCP regarding this pain previously. Pt reports "crunching" within knee joint when going up and down stairs  R knee swelling: intermittently yes R knee mechanical symptoms: yes- "crunching" Aggravating factors: squatting; R leg single leg stance; running, Treatments tried: NSAIDs, compression sleeve, icy hot, ointment  Diagnostic imaging: R knee XR- 03/10/21  Past medical history, Surgical history, Family history, Social history, Allergies, and medications have been entered into the medical record, reviewed.   Review of Systems: No new headache, visual changes, nausea, vomiting, diarrhea, constipation, dizziness, abdominal pain, skin rash, fevers, chills, night sweats, weight loss, swollen lymph nodes, body aches, joint swelling, muscle aches, chest pain, shortness of breath, mood changes, visual or auditory hallucinations.   Objective:    Vitals:   03/31/21 1123  BP: 106/74  Pulse: 97  SpO2: 98%   General: Well Developed, well nourished, and in no acute distress.  Neuro/Psych: Alert and oriented x3, extra-ocular muscles intact, able to move all 4 extremities, sensation grossly intact. Skin: Warm and dry, no rashes noted.  Respiratory: Not using accessory muscles, speaking in full sentences, trachea midline.  Cardiovascular: Pulses palpable, no extremity edema. Abdomen: Does not appear distended. MSK: Right knee increased Q angle otherwise normal. Normal motion. Significant crepitation with knee extension. Knee not particularly tender to palpation. Stable ligamentous exam. Intact strength. Negative Murray's  test.   Lab and Radiology Results  DG Knee AP/LAT W/Sunrise Right  Result Date: 03/11/2021 CLINICAL DATA:  Knee pain.  Chronic pain of right knee EXAM: RIGHT KNEE 3 VIEWS COMPARISON:  None. FINDINGS: No evidence of fracture, dislocation, or joint effusion. Normal joint spaces and alignment. Patella normally situated in the trochlear groove. There is trace lateral patellar spurring. Minimal spurring of the tibial spines. No erosion or bony destruction. Soft tissues are unremarkable. IMPRESSION: Trace lateral patellar and tibial spine spurring suggesting early degenerative change. Electronically Signed   By: Narda Rutherford M.D.   On: 03/11/2021 11:21   I, [name], personally (independently) visualized and performed the interpretation of the images attached in this note.  Diagnostic Limited MSK Ultrasound of: Right knee Quad tendon intact normal-appearing Patellar tendon normal. Lateral joint line normal-appearing Medial joint line trace hypoechoic change at medial meniscus without visible tear. Impression: Normal-appearing knee   Impression and Recommendations:    Assessment and Plan: 35 y.o. female with right anterior knee pain with significant crepitation with extension.  Patient has clinical and radiographic findings consistent with chondromalacia patella and patellofemoral pain syndrome.  She is failing early conservative management on her own.  Plan for physical therapy dedicated for VMO strengthening.  Additionally she will be a good candidate for Kinesiotape.  Recheck in 6 weeks.  If not better consider MRI and more aggressive bracing.  Could also consider injection.Marland Kitchen  PDMP not reviewed this encounter. Orders Placed This Encounter  Procedures  . [place] LIMITED JOINT SPACE STRUCTURES LOW RIGHT(NO LINKED CHARGES)    Standing Status:   Future    Number of Occurrences:   1    Standing Expiration Date:  09/30/2021    Order Specific Question:   Reason for Exam (SYMPTOM  OR DIAGNOSIS  REQUIRED)    Answer:   right knee pain    Order Specific Question:   Preferred imaging location?    Answer:   Adult nurse [facility]  . Ambulatory referral to Physical Therapy    Referral Priority:   Routine    Referral Type:   Physical Medicine    Referral Reason:   Specialty Services Required    Requested Specialty:   Physical Therapy   No orders of the defined types were placed in this encounter.   Discussed warning signs or symptoms. Please see discharge instructions. Patient expresses understanding.   The above documentation has been reviewed and is accurate and complete [name], M.D.

## 2021-03-31 ENCOUNTER — Ambulatory Visit (INDEPENDENT_AMBULATORY_CARE_PROVIDER_SITE_OTHER): Payer: 59 | Admitting: Family Medicine

## 2021-03-31 ENCOUNTER — Other Ambulatory Visit: Payer: Self-pay

## 2021-03-31 ENCOUNTER — Ambulatory Visit: Payer: Self-pay

## 2021-03-31 VITALS — BP 106/74 | HR 97 | Ht 62.0 in | Wt 160.0 lb

## 2021-03-31 DIAGNOSIS — M25561 Pain in right knee: Secondary | ICD-10-CM

## 2021-03-31 DIAGNOSIS — M2241 Chondromalacia patellae, right knee: Secondary | ICD-10-CM | POA: Insufficient documentation

## 2021-03-31 NOTE — Patient Instructions (Addendum)
Thank you for coming in today.  I've referred you to Physical Therapy.  Let us know if you don't hear from them in one week.  Please use Voltaren gel (Generic Diclofenac Gel) up to 4x daily for pain as needed.  This is available over-the-counter as both the name brand Voltaren gel and the generic diclofenac gel.  I am treating for patellofemoral pain syndrome and chrondromalacia patella.   Recheck in 6 weeks.   If not better we can do more.   Ok to exercise. Try to avoid deep lunges or squats.

## 2021-04-01 ENCOUNTER — Other Ambulatory Visit: Payer: Self-pay | Admitting: Family Medicine

## 2021-04-19 ENCOUNTER — Telehealth: Payer: Self-pay

## 2021-04-19 ENCOUNTER — Encounter (HOSPITAL_COMMUNITY): Payer: Self-pay | Admitting: Emergency Medicine

## 2021-04-19 ENCOUNTER — Other Ambulatory Visit: Payer: Self-pay

## 2021-04-19 ENCOUNTER — Ambulatory Visit (HOSPITAL_COMMUNITY)
Admission: EM | Admit: 2021-04-19 | Discharge: 2021-04-19 | Disposition: A | Discharge status: Home / Self Care | Payer: 59 | Attending: Physician Assistant | Admitting: Physician Assistant

## 2021-04-19 DIAGNOSIS — K1120 Sialoadenitis, unspecified: Secondary | ICD-10-CM | POA: Diagnosis not present

## 2021-04-19 DIAGNOSIS — R220 Localized swelling, mass and lump, head: Secondary | ICD-10-CM | POA: Diagnosis present

## 2021-04-19 DIAGNOSIS — R638 Other symptoms and signs concerning food and fluid intake: Secondary | ICD-10-CM | POA: Diagnosis present

## 2021-04-19 LAB — COMPREHENSIVE METABOLIC PANEL
ALT: 20 U/L (ref 0–44)
AST: 28 U/L (ref 15–41)
Albumin: 4.1 g/dL (ref 3.5–5.0)
Alkaline Phosphatase: 56 U/L (ref 38–126)
Anion gap: 8 (ref 5–15)
BUN: 16 mg/dL (ref 6–20)
CO2: 24 mmol/L (ref 22–32)
Calcium: 9 mg/dL (ref 8.9–10.3)
Chloride: 107 mmol/L (ref 98–111)
Creatinine, Ser: 0.66 mg/dL (ref 0.44–1.00)
GFR, Estimated: >60 mL/min (ref >60)
Glucose, Bld: 87 mg/dL (ref 70–99)
Potassium: 3.7 mmol/L (ref 3.5–5.1)
Sodium: 139 mmol/L (ref 135–145)
Total Bilirubin: 0.4 mg/dL (ref 0.3–1.2)
Total Protein: 7.2 g/dL (ref 6.5–8.1)

## 2021-04-19 LAB — CBC WITH DIFFERENTIAL/PLATELET
Abs Immature Granulocytes: 0.01 10*3/uL (ref 0.00–0.07)
Basophils Absolute: 0 10*3/uL (ref 0.0–0.1)
Basophils Relative: 1 %
Eosinophils Absolute: 0.1 10*3/uL (ref 0.0–0.5)
Eosinophils Relative: 2 %
HCT: 42.6 % (ref 36.0–46.0)
Hemoglobin: 14.1 g/dL (ref 12.0–15.0)
Immature Granulocytes: 0 %
Lymphocytes Relative: 21 %
Lymphs Abs: 1.2 10*3/uL (ref 0.7–4.0)
MCH: 28.5 pg (ref 26.0–34.0)
MCHC: 33.1 g/dL (ref 30.0–36.0)
MCV: 86.1 fL (ref 80.0–100.0)
Monocytes Absolute: 0.6 10*3/uL (ref 0.1–1.0)
Monocytes Relative: 10 %
Neutro Abs: 3.9 10*3/uL (ref 1.7–7.7)
Neutrophils Relative %: 66 %
Platelets: 199 10*3/uL (ref 150–400)
RBC: 4.95 MIL/uL (ref 3.87–5.11)
RDW: 13.1 % (ref 11.5–15.5)
WBC: 5.8 10*3/uL (ref 4.0–10.5)
nRBC: 0 % (ref 0.0–0.2)

## 2021-04-19 MED ORDER — AMOXICILLIN-POT CLAVULANATE 400-57 MG/5ML PO SUSR: 875.0000 mg | Freq: Two times a day (BID) | ORAL | 0 refills | Status: AC

## 2021-04-19 NOTE — Telephone Encounter (Signed)
Pt needs ov with me or other provider or urgent care. Not sure why they sent her to ER. You can call and find out

## 2021-04-19 NOTE — Telephone Encounter (Signed)
FYI  Patient has an appointment on the 18th for a follow up on mood, does she need a separate appointment to address this?

## 2021-04-19 NOTE — Telephone Encounter (Signed)
Nurse Assessment Nurse: Burman Blacksmith, RN, Kathlene November Date/Time (Eastern Time): 04/19/2021 6:57:13 AM Confirm and document reason for call. If symptomatic, describe symptoms. ---Caller states My right jaw is swollen and painful since Saturday. OTC meds help pain feels as if it is spreading down my throat now. and I have ear drainage as well Denies any other symptoms Does the patient have any new or worsening symptoms? ---Yes Will a triage be completed? ---Yes Related visit to physician within the last 2 weeks? ---No Does the PT have any chronic conditions? (i.e. diabetes, asthma, this includes High risk factors for pregnancy, etc.) ---No Is the patient pregnant or possibly pregnant? (Ask all females between the ages of 38-55) ---No Is this a behavioral health or substance abuse call? ---No Guidelines Guideline Title Affirmed Question Affirmed Notes Nurse Date/Time (Eastern Time) Ear - Discharge Patient sounds very sick or weak to the triager Emch, RN, Kathlene November 04/19/2021 7:00:43 AM Disp. Time Lamount Cohen Time) Disposition Final User 04/19/2021 6:45:53 AM Attempt made - message left Emch, RN, Kathlene November PLEASE NOTE: All timestamps contained within this report are represented as [place] Standard Time. CONFIDENTIALTY NOTICE: This fax transmission is intended only for the addressee. It contains information that is legally privileged, confidential or otherwise protected from use or disclosure. If you are not the intended recipient, you are strictly prohibited from reviewing, disclosing, copying using or disseminating any of this information or taking any action in reliance on or regarding this information. If you have received this fax in error, please notify us immediately by telephone so that we can arrange for its return to [place]. Phone: [phone], Toll-Free: [phone], Fax: [phone] Page: 2 of 2 Call Id: [id number] 04/19/2021 7:05:10 AM Go to ED Now (or PCP triage) Yes Emch, RN, Rosalita Levan Disagree/Comply  Comply Caller Understands Yes PreDisposition Did not know what to do Care Advice Given Per Guideline GO TO ED NOW (OR PCP TRIAGE): CARE ADVICE given per Ear - Discharge (Adult) guideline. Referrals [facility] at [facility]

## 2021-04-19 NOTE — ED Triage Notes (Signed)
Patient c/o right sided jaw swelling since Friday.  Patient has seen a dentist and it's not tooth related.  No injury, very painful, pain radiates down throat.  Patient took Tylenol yesterday w/o relief.

## 2021-04-19 NOTE — Telephone Encounter (Signed)
Spoke with patient, she was seen at urgent care today regarding triage note.

## 2021-04-19 NOTE — ED Provider Notes (Signed)
[name]    CSN: [id number] Arrival date & time: 04/19/21  0815      History   Chief Complaint Chief Complaint  Patient presents with  . Facial Swelling    HPI Carol May is a 35 y.o. female.   Patient presents today with a 3-day history of right-sided jaw pain and swelling.  Patient denies any injury or change in activity prior to symptom onset.  She has been seen by dentist who stated is not a dental problem.  She does have a history of bruxism and uses mouthguard at night.  Pain is rated 10 on a 0-10 pain scale, localized to right preauricular region with radiation inferiorly into neck, described as aching, worse with swallowing, no alleviating factors identified.  She has tried Tylenol and cool compresses with improvement but not resolution of symptoms.  Denies history of TMJ.  Does report decreased oral intake due to pain with swallowing; has been drinking plenty of fluids but is not eating in approximately 24 hours.  She denies any muffled voice, shortness of breath, inability to swallow.  Denies episodes of similar symptoms in the past.  She is up-to-date on vaccinations including childhood vaccinations such as MMR.     Past Medical History:  Diagnosis Date  . AR (allergic rhinitis)   . Cervical dysplasia 01/21/2019   CIN 1-2; s/p LEEP 01/2019 Dr. Sandford Craze  . H/O varicella     Patient Active Problem List   Diagnosis Date Noted  . Patellofemoral chondrosis of right knee 03/31/2021  . Right anterior knee pain 03/31/2021  . Adjustment disorder with mixed anxiety and depressed mood 03/10/2021  . Cervical dysplasia 01/21/2019  . Presence of subcutaneous contraceptive implant 09/12/2018  . AR (allergic rhinitis) 04/01/2014    Past Surgical History:  Procedure Laterality Date  . COLPOSCOPY  12/19/2018  . OPEN REDUCTION INTERNAL FIXATION (ORIF) TIBIA/FIBULA FRACTURE Right 08/11/2016   Procedure: OPEN REDUCTION INTERNAL FIXATION (ORIF) RIGHT  TIBIA/FIBULA  PILON FRACTURE;  Surgeon: Wylene Simmer, MD;  Location: [place];  Service: Orthopedics;  Laterality: Right;    OB History    Gravida  3   Para  3   Term  3   Preterm  0   AB  0   Living  3     SAB  0   IAB  0   Ectopic  0   Multiple  0   Live Births  3            Home Medications    Prior to Admission medications   Medication Sig Start Date End Date Taking? Authorizing Provider  amoxicillin-clavulanate (AUGMENTIN) 400-57 MG/5ML suspension Take 10.9 mLs (875 mg total) by mouth 2 (two) times daily for 10 days. 04/19/21 04/29/21 Yes Lorene Klimas K, PA-C  escitalopram (LEXAPRO) 10 MG tablet TAKE 1 TABLET BY MOUTH EVERY DAY 04/02/21  Yes Leamon Arnt, MD  UNABLE TO FIND Med Name: Herbal Swiss Patient not taking: No sig reported    [provider]    Family History Family History  Problem Relation Age of Onset  . Hyperlipidemia Father   . Asthma Brother   . Healthy Daughter   . Healthy Son   . Healthy Son   . Healthy Mother     Social History Social History   Tobacco Use  . Smoking status: Never Smoker  . Smokeless tobacco: Never Used  Vaping Use  . Vaping Use: Never used  Substance Use Topics  .  Alcohol use: No  . Drug use: No     Allergies   Patient has no known allergies.   Review of Systems Review of Systems  Constitutional: Negative for activity change, appetite change, fatigue and fever.  HENT: Positive for facial swelling and sore throat. Negative for congestion, dental problem, sinus pressure and sneezing.   Respiratory: Negative for cough and shortness of breath.   Cardiovascular: Negative for chest pain.  Gastrointestinal: Negative for abdominal pain, diarrhea, nausea and vomiting.  Musculoskeletal: Negative for arthralgias and myalgias.  Neurological: Negative for dizziness, light-headedness and headaches.     Physical Exam Triage Vital Signs ED Triage Vitals  Enc Vitals Group     BP  04/19/21 0830 104/72     Pulse Rate 04/19/21 0830 74     Resp --      Temp 04/19/21 0830 99.3 F (37.4 C)     Temp Source 04/19/21 0830 Oral     SpO2 04/19/21 0830 98 %     Weight --      Height --      Head Circumference --      Peak Flow --      Pain Score 04/19/21 0831 10     Pain Loc --      Pain Edu? --      Excl. in [place]? --    No data found.  Updated Vital Signs BP 104/72 (BP Location: Right Arm)   Pulse 74   Temp 99.3 F (37.4 C) (Oral)   LMP 04/05/2021   SpO2 98%   Visual Acuity Right Eye Distance:   Left Eye Distance:   Bilateral Distance:    Right Eye Near:   Left Eye Near:    Bilateral Near:     Physical Exam Vitals reviewed.  Constitutional:      General: She is awake. She is not in acute distress.    Appearance: Normal appearance. She is not ill-appearing.     Comments: Very pleasant female appears stated age in no acute distress  HENT:     Head: Normocephalic and atraumatic.     Jaw: No tenderness or pain on movement.     Salivary Glands: Right salivary gland is diffusely enlarged and tender. Left salivary gland is not diffusely enlarged or tender.     Right Ear: Tympanic membrane, ear canal and external ear normal. Tympanic membrane is not erythematous or bulging.     Left Ear: Tympanic membrane, ear canal and external ear normal. Tympanic membrane is not erythematous or bulging.     Nose:     Right Sinus: No maxillary sinus tenderness or frontal sinus tenderness.     Left Sinus: No maxillary sinus tenderness or frontal sinus tenderness.     Mouth/Throat:     Mouth: Mucous membranes are moist.     Dentition: Normal dentition.     Pharynx: Uvula midline. No oropharyngeal exudate or posterior oropharyngeal erythema.     Tonsils: No tonsillar exudate or tonsillar abscesses. 1+ on the right. 1+ on the left.  Cardiovascular:     Rate and Rhythm: Normal rate and regular rhythm.     Heart sounds: No murmur heard.   Pulmonary:     Effort: Pulmonary  effort is normal.     Breath sounds: Normal breath sounds. No wheezing, rhonchi or rales.     Comments: Clear to auscultation bilaterally Lymphadenopathy:     Head:     Right side of head: No submental, submandibular or  tonsillar adenopathy.     Left side of head: No submental, submandibular or tonsillar adenopathy.     Cervical: No cervical adenopathy.  Psychiatric:        Behavior: Behavior is cooperative.      UC Treatments / Results  Labs (all labs ordered are listed, but only abnormal results are displayed) Labs Reviewed  CBC WITH DIFFERENTIAL/PLATELET  COMPREHENSIVE METABOLIC PANEL    EKG   Radiology No results found.  Procedures Procedures (including critical care time)  Medications Ordered in UC Medications - No data to display  Initial Impression / Assessment and Plan / UC Course  I have reviewed the triage vital signs and the nursing notes.  Pertinent labs & imaging results that were available during my care of the patient were reviewed by me and considered in my medical decision making (see chart for details).     Concern for parotitis so patient was started on Augmentin liquid given difficulty swallowing.  Recommended she use Tylenol and ibuprofen for pain relief.  Basic labs obtained today given decreased oral intake-results pending.  Discussed need for additional testing including HIV and potentially CT scan if symptoms do not quickly improve this would need to be arranged through PCP or ENT.  She was given contact information for ENT and instructed to call them if symptoms not improved within 24 hours.  Strict return precautions given to which patient expressed understanding.  Final Clinical Impressions(s) / UC Diagnoses   Final diagnoses:  Parotitis  Facial swelling  Decreased oral intake     Discharge Instructions     I am concerned that you have an infection of your salivary gland.  I have called in Augmentin twice daily which should treat an  infection.  You should drink plenty of fluid and use lemon drops or other sour candies to help increase salivation.  You can continue with Tylenol and ibuprofen for pain.  Use warm compress on affected area.  If you do not have improvement within 24 hours you need to call ear nose and throat provider as we discussed.  If anything worsens including muffled voice, difficulty swallowing, nausea/vomiting need to go to the hospital.    ED Prescriptions    Medication Sig Dispense Auth. Provider   amoxicillin-clavulanate (AUGMENTIN) 400-57 MG/5ML suspension Take 10.9 mLs (875 mg total) by mouth 2 (two) times daily for 10 days. 225 mL Ardean Melroy K, PA-C     PDMP not reviewed this encounter.   Terrilee Croak, PA-C 04/19/21 [phone]

## 2021-04-19 NOTE — Discharge Instructions (Signed)
I am concerned that you have an infection of your salivary gland.  I have called in Augmentin twice daily which should treat an infection.  You should drink plenty of fluid and use lemon drops or other sour candies to help increase salivation.  You can continue with Tylenol and ibuprofen for pain.  Use warm compress on affected area.  If you do not have improvement within 24 hours you need to call ear nose and throat provider as we discussed.  If anything worsens including muffled voice, difficulty swallowing, nausea/vomiting need to go to the hospital.

## 2021-04-20 ENCOUNTER — Telehealth: Payer: Self-pay

## 2021-04-20 NOTE — Telephone Encounter (Signed)
Pt called stating her jaw has gotten worse. Its swollen and inflamed. Pt states she has been taking her medication but wanted to let Dr. Mardelle Matte know. Please advise.

## 2021-04-20 NOTE — Telephone Encounter (Signed)
Please advise  Patient was seen at Urgent Care yesterday, patient has an appointment tomorrow with [facility] for a separate issue. Let her know yesterday she may have to schedule a separate visit outside of issues addressed tomorrow for jaw pains.

## 2021-04-21 ENCOUNTER — Encounter: Payer: Self-pay | Admitting: Family Medicine

## 2021-04-21 ENCOUNTER — Ambulatory Visit (INDEPENDENT_AMBULATORY_CARE_PROVIDER_SITE_OTHER): Payer: 59 | Admitting: Family Medicine

## 2021-04-21 ENCOUNTER — Other Ambulatory Visit: Payer: Self-pay

## 2021-04-21 ENCOUNTER — Encounter: Payer: Self-pay | Admitting: Physical Therapy

## 2021-04-21 ENCOUNTER — Ambulatory Visit (INDEPENDENT_AMBULATORY_CARE_PROVIDER_SITE_OTHER): Payer: 59 | Admitting: Physical Therapy

## 2021-04-21 VITALS — BP 124/78 | HR 84 | Temp 98.6°F | Ht 62.0 in | Wt 159.2 lb

## 2021-04-21 DIAGNOSIS — K1121 Acute sialoadenitis: Secondary | ICD-10-CM

## 2021-04-21 DIAGNOSIS — M2241 Chondromalacia patellae, right knee: Secondary | ICD-10-CM

## 2021-04-21 DIAGNOSIS — M25561 Pain in right knee: Secondary | ICD-10-CM

## 2021-04-21 DIAGNOSIS — F4323 Adjustment disorder with mixed anxiety and depressed mood: Secondary | ICD-10-CM | POA: Diagnosis not present

## 2021-04-21 DIAGNOSIS — G8929 Other chronic pain: Secondary | ICD-10-CM

## 2021-04-21 DIAGNOSIS — M6281 Muscle weakness (generalized): Secondary | ICD-10-CM

## 2021-04-21 NOTE — Therapy (Addendum)
[facility] [address] [place], [place], [address] Phone: [phone]   Fax:  [phone]  Physical Therapy Evaluation/Discharge Summary  Patient Details  Name: Chloris Marcoux MRN: [id number] Date of Birth: 06/29/1986 Referring Provider (PT): Gregor Hams, MD   Encounter Date: 04/21/2021   PT End of Session - 04/21/21 1336     Visit Number 1    Number of Visits 6    Date for PT Re-Evaluation 06/02/21    Authorization Type [employer]    PT Start Time 1302    PT Stop Time 1332    PT Time Calculation (min) 30 min    Activity Tolerance Patient tolerated treatment well    Behavior During Therapy [facility] for tasks assessed/performed             Past Medical History:  Diagnosis Date   AR (allergic rhinitis)    Cervical dysplasia 01/21/2019   CIN 1-2; s/p LEEP 01/2019 Dr. Sandford Craze   H/O varicella     Past Surgical History:  Procedure Laterality Date   COLPOSCOPY  12/19/2018   OPEN REDUCTION INTERNAL FIXATION (ORIF) TIBIA/FIBULA FRACTURE Right 08/11/2016   Procedure: OPEN REDUCTION INTERNAL FIXATION (ORIF) RIGHT TIBIA/FIBULA  PILON FRACTURE;  Surgeon: Wylene Simmer, MD;  Location: [place];  Service: Orthopedics;  Laterality: Right;    There were no vitals filed for this visit.    Subjective Assessment - 04/21/21 1305     Subjective Pt is a 35 y/o female who presents to OPPT for Rt anterior knee pain x 2 years.  She reports doing heavy lifting until Dec 2021, and has since decreased weight with strength training.  She has a compression sleeve she wears when at work and working out.  She does express some locking and popping with increased pain.    Patient Stated Goals improve pain, be able to continue strength training    Currently in Pain? Yes    Pain Score 4    up to 8/10; at best 1/10   Pain Location Knee    Pain Orientation Right    Pain Descriptors / Indicators Aching    Pain Type Chronic pain    Pain  Onset More than a month ago    Pain Frequency Intermittent    Aggravating Factors  squatting, bending, stairs    Pain Relieving Factors tylenol, ice, voltaren                [facility] PT Assessment - 04/21/21 1309       Assessment   Medical Diagnosis M25.561 (ICD-10-CM) - Right anterior knee pain  M22.41 (ICD-10-CM) - Patellofemoral chondrosis of right knee    Referring Provider (PT) Gregor Hams, MD    Onset Date/Surgical Date --   chronic x 2 years   Hand Dominance Right    Next MD Visit 05/12/21    Prior Therapy none      Precautions   Precautions None      Restrictions   Weight Bearing Restrictions No      Balance Screen   Has the patient fallen in the past 6 months No    Has the patient had a decrease in activity level because of a fear of falling?  No    Is the patient reluctant to leave their home because of a fear of falling?  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, sitting, squatting    Leisure be outside, running/walking on treadmill, strength training (cardio 30 min 4x/wk)      Cognition   Overall Cognitive Status Within Functional Limits for tasks assessed      Observation/Other Assessments   Focus on Therapeutic Outcomes (FOTO)  76 (predicted 85)      ROM / Strength   AROM / PROM / Strength AROM;Strength      AROM   Overall AROM Comments bil knees WNL - bil hyperextension noted    AROM Assessment Site Knee      Strength   Strength Assessment Site  Knee;Hip    Right/Left Hip Right    Right Hip Extension 4/5    Right Hip ABduction 4/5    Right Hip ADduction 4/5    Right/Left Knee Right;Left    Right Knee Flexion 5/5    Right Knee Extension 5/5   poor VMO activation   Left Knee Flexion 5/5    Left Knee Extension 5/5      Palpation   Patella mobility lateral tracking noted bil    Palpation comment tender along Rt lateral knee joint line      Special Tests   Other special tests Rt knee meniscal tests negative                        Objective measurements completed on examination: See above findings.       [facility] Adult PT Treatment/Exercise - 04/21/21 1334       Exercises   Exercises Other Exercises    Other Exercises  see pt instructions - verbally reviewed with pt and pt verbalized understanding                    PT Education - 04/21/21 1335     Education Details HEP    Person(s) Educated Patient    Methods Explanation;Demonstration;Handout    Comprehension Verbalized understanding;Returned demonstration;Need further instruction              PT Short Term Goals - 04/21/21 1413       PT SHORT TERM GOAL #1   Title indepdent with initial HEP    Time 3    Period Weeks    Status New    Target Date 05/12/21               PT Long Term Goals - 04/21/21 1413       PT LONG TERM GOAL #1   Title indepdent with final HEP    Time 6    Period Weeks    Status New    Target Date 06/02/21      PT LONG TERM GOAL #2   Title FOTO score improved to 85 for improved function    Time 6    Period Weeks    Status New    Target Date 06/02/21      PT LONG TERM GOAL #3   Title demonstrate 5/5 Rt hip strength for  improved function    Time 6    Period Weeks    Status New    Target Date 06/02/21      PT LONG TERM GOAL #4   Title report pain < 3/10 with stairs and exercise for improved function    Time 6    Period Weeks    Status New    Target Date 06/02/21                     Plan - 04/21/21 1336     Clinical Impression Statement Pt is a 35 y/o female who presents to OPPT for acute exacerbation of chronic Rt knee pain.  She demonstrates decreased strength and pain affecting functional mobility.  Pt will benefit from PT to address deficits listed.    Personal Factors and Comorbidities Comorbidity 2    Comorbidities Rt ankle ORIF, depression    Examination-Activity Limitations Bend;Squat;Caring for Others;Stairs;Carry;Stand;Locomotion Level    Examination-Participation Restrictions Community Activity;Occupation    Stability/Clinical Decision Making Stable/Uncomplicated    Clinical Decision Making Low    Rehab Potential Good    PT Frequency 1x / week   may see every other week   PT Duration 6 weeks    PT Treatment/Interventions ADLs/Self Care Home Management;Aquatic Therapy;Cryotherapy;Electrical Stimulation;Iontophoresis 51m/ml Dexamethasone;Moist Heat;Balance training;Therapeutic exercise;Therapeutic activities;Functional mobility training;Stair training;Gait training;Neuromuscular re-education;Patient/family education;Orthotic Fit/Training;Manual techniques;Vasopneumatic Device;Taping;Dry needling    PT Next Visit Plan review HEP, progress as needed - loading hips/knees    PT Home Exercise Plan Access Code: [email or web address]   Consulted and Agree with Plan of Care Patient             Patient will benefit from skilled therapeutic intervention in order to improve the following deficits and impairments:  Pain,Decreased strength  Visit Diagnosis: Chronic pain of right knee - Plan: PT plan of care cert/re-cert  Muscle weakness (generalized) - Plan: PT plan of care cert/re-cert     Problem List Patient Active Problem List   Diagnosis Date Noted   Patellofemoral chondrosis of right knee 03/31/2021   Right anterior knee pain 03/31/2021   Adjustment disorder with mixed anxiety and depressed mood 03/10/2021   Cervical dysplasia 01/21/2019    Presence of subcutaneous contraceptive implant 09/12/2018   AR (allergic rhinitis) 04/01/2014      SLaureen Abrahams PT, DPT 04/21/21 2:17 PM    [facility] [address] [address]Phone: [phone]  Fax:  [phone] Name: ESameerah NachtigalMRN: [id number] of Birth: 808/25/1987   PHYSICAL THERAPY DISCHARGE SUMMARY  Visits from Start of Care: 1  Current functional level related to goals / functional outcomes: See above   Remaining deficits: See above   Education / Equipment: HEP   Patient agrees to discharge. Patient goals were not met. Patient is being discharged due to not returning since the last visit.  SLaureen Abrahams PT, DPT 08/10/21 9:59 AM  [facility] [address] [address]Phone: [phone]  Fax:  [phone]

## 2021-04-21 NOTE — Patient Instructions (Signed)
Please return in 3 months to recheck mood and side effects of medication.  If you have any questions or concerns, please don't hesitate to send me a message via MyChart or call the office at [phone]. Thank you for visiting with [place] today! It's our pleasure caring for you.   Parotitis  Parotitis is inflammation of one or both of your parotid glands. These glands produce saliva. They are found on each side of your face, below and in front of your earlobes. The saliva that they produce comes out of tiny openings (ducts) inside your cheeks. Parotitis may cause sudden swelling and pain (acute parotitis). It can also cause repeated episodes of swelling and pain or continued swelling that may or may not be painful (chronic parotitis). What are the causes? This condition may be caused by:  Infections from bacteria.  Infections from viruses, such as mumps or HIV.  Blockage (obstruction) of saliva flow through the parotid glands. This can be from a stone, scar tissue, or a tumor.  Diseases that cause your body's defense system (immune system) to attack healthy cells in your salivary glands. These are called autoimmune diseases. What increases the risk? You are more likely to develop this condition if:  You are 48 years old or older.  You do not drink enough fluids (are dehydrated).  You drink too much alcohol.  You have: ? A dry mouth. ? Poor dental hygiene. ? Diabetes. ? Gout. ? A long-term illness.  You have had radiation treatments to the head and neck.  You take certain medicines. What are the signs or symptoms? Symptoms of this condition depend on the cause. Symptoms may include:  Swelling under and in front of the ear. This may get worse after eating.  Redness of the skin over the parotid gland.  Pain and tenderness over the parotid gland. This may get worse after eating.  Fever or chills.  Pus coming from the ducts inside the mouth.  Dry mouth.  A bad taste in  the mouth. How is this diagnosed? This condition may be diagnosed based on:  Your medical history.  A physical exam.  Tests to find the cause of the parotitis. These may include: ? Doing blood tests to check for an autoimmune disease or infections from a virus. ? Taking a fluid sample from the parotid gland and testing it for infection. ? Injecting the ducts of the parotid gland with a dye and then taking X-rays (sialogram). ? Having other imaging tests of the gland, such as X-rays, ultrasound, MRI, or CT scan. ? Checking the opening of the gland for a stone or obstruction. ? Placing a needle into the gland to remove tissue for a biopsy (fine needle aspiration). How is this treated? Treatment for this condition depends on the cause. Treatment may include:  Antibiotic medicine for a bacterial infection.  Drinking more fluids.  Removing a stone or obstruction.  Treating an underlying disease that is causing parotitis.  Surgery to drain an infection, remove a growth, or remove the whole gland (parotidectomy). Treatment may not be needed if parotid swelling goes away with home care. Follow these instructions at home: Medicines  Take over-the-counter and prescription medicines only as told by your health care provider.  If you were prescribed an antibiotic medicine, take it as told by your health care provider. Do not stop taking the antibiotic even if you start to feel better.   Managing pain and swelling  If directed, apply heat to the affected  area as often as told by your health care provider. Use the heat source that your health care provider recommends, such as a moist heat pack or a heating pad. To apply the heat: ? Place a towel between your skin and the heat source. ? Leave the heat on for 20-30 minutes. ? Remove the heat if your skin turns bright red. This is especially important if you are unable to feel pain, heat, or cold. You may have a greater risk of getting  burned.  Gargle with a salt-water mixture 3-4 times a day or as needed. To make a salt-water mixture, completely dissolve -1 tsp (3-6 g) of salt in 1 cup (237 mL) of warm water.  Gently massage the parotid glands as told by your health care provider. General instructions  Drink enough fluid to keep your urine pale yellow.  Keep your mouth clean and moist.  Try sucking on sour candy. This may help to make your mouth less dry by stimulating the flow of saliva.  Maintain good oral health. ? Brush your teeth at least two times a day. ? Floss your teeth every day. ? See your dentist regularly.  Do not use any products that contain nicotine or tobacco, such as cigarettes, e-cigarettes, and chewing tobacco. If you need help quitting, ask your health care provider.  Do not drink alcohol.  Keep all follow-up visits as told by your health care provider. This is important.   Contact a health care provider if:  You have a fever or chills.  You have new symptoms.  Your symptoms get worse.  Your symptoms do not improve with treatment. Get help right away if:  You have difficulty breathing or swallowing because of the swollen gland. Summary  Parotitis is inflammation of one or both of your parotid glands.  Symptoms include pain and swelling under and in front of the ear. They may also include a fever and a bad taste in your mouth.  This condition may be treated with antibiotics, increasing fluids, or surgery.  In some cases, parotitis may go away on its own without treatment.  You should drink plenty of fluids, maintain good oral hygiene, and avoid tobacco products. This information is not intended to replace advice given to you by your health care provider. Make sure you discuss any questions you have with your health care provider. Document Revised: 06/19/2018 Document Reviewed: 06/19/2018 Elsevier Patient Education  2021 [employer].

## 2021-04-21 NOTE — Patient Instructions (Signed)
Access Code: [email or web address] URL: [email or web address] Date: 04/21/2021 Prepared by: Moshe Cipro  Exercises Single Leg Bridge - 2 x daily - 7 x weekly - 3 sets - 10 reps Sidelying Hip Abduction - 2 x daily - 7 x weekly - 3 sets - 20 reps Knee Extension with Weight Machine - 1 x daily - 4 x weekly - 3 sets - 10 reps Goblet Squat with Kettlebell - 1 x daily - 4 x weekly - 3 sets - 10 reps Kettlebell Deadlift - 1 x daily - 4 x weekly - 3 sets - 10 reps

## 2021-04-21 NOTE — Progress Notes (Signed)
Subjective  CC:  Chief Complaint  Patient presents with  . Anxiety  . Jaw Pain    Swollen has went down, would just like you to take a look.  . Depression    Lexapro is working well, wants to discuss side effects     HPI: Carol May is a 35 y.o. female who presents to the office today to address the problems listed above in the chief complaint, mood problems.  6 weeks follow-up after starting Lexapro 10 mg daily for adjustment reaction.  Fortunately, she has had a significant positive effect.  Less negative symptoms including less sadness, depressed mood, irritability, immobility.  Her energy is not completely normalized but she is definitely able to get out of bed, take care of her children better and going to work daily.  Anxiety is less as well.  Managing stressors at work better.  She has noticed decreased libido as a side effect from her medication.     Depression screen [facility] 2/9 04/21/2021 03/10/2021 11/06/2019  Decreased Interest 0 3 0  Down, Depressed, Hopeless 0 0 0  PHQ - 2 Score 0 3 0  Altered sleeping 1 1 0  Tired, decreased energy 1 0 0  Change in appetite 0 0 0  Feeling bad or failure about yourself  2 0 0  Trouble concentrating 2 3 0  Moving slowly or fidgety/restless 0 0 0  Suicidal thoughts 0 0 0  PHQ-9 Score 6 7 0  Difficult doing work/chores Somewhat difficult Somewhat difficult Not difficult at all   GAD 7 : Generalized Anxiety Score 04/21/2021 03/10/2021  Nervous, Anxious, on Edge 1 3  Control/stop worrying 1 3  Worry too much - different things 1 3  Trouble relaxing 0 2  Restless 0 0  Easily annoyed or irritable 0 3  Afraid - awful might happen 0 3  Total GAD 7 Score 3 17  Anxiety Difficulty Not difficult at all Extremely difficult    Reviewed emergency room notes from May 16.  Diagnosed with parotitis.  She was put on Augmentin.  She reports she had right-sided facial swelling and tenderness without redness, fever or severe pain.  She did have some  limitation in opening her mouth all the way due to the swelling.  No imaging studies were done.  Fortunately, symptoms are improving.  Swelling is decreasing.  She has mild tenderness remaining.  Eating and drinking well.  Right patellofemoral syndrome: Reviewed sports medicine notes.  Starting physical therapy today.  Chronic anterior knee pain persists.   Assessment  1. Parotitis, acute   2. Adjustment disorder with mixed anxiety and depressed mood   3. Right anterior knee pain   4. Patellofemoral chondrosis of right knee      Plan   Parotitis acute: Viral versus obstructive.  Doubt bacterial.  We will continue antibiotics for 5 days since it is improving.  Red flag symptoms discussed.  If worsening, would order sialogram.  Patient stands and agrees.  Adjustment disorder: Much improved.  Continue Lexapro 10 mg daily.  Discussed further expectations and prognosis.  Discussed side effects.  Decreased libido in part could be due to depressive symptoms but did explain that this is a side effect from SSRIs.  Recheck 3 months  Patellofemoral syndrome: For physical therapy and VMO strengthening.  Follow up: 3 months for recheck mood, sooner if needed for parotitis, worsening No orders of the defined types were placed in this encounter.  No orders of the defined  types were placed in this encounter.     I reviewed the patients updated PMH, FH, and SocHx.    Patient Active Problem List   Diagnosis Date Noted  . Patellofemoral chondrosis of right knee 03/31/2021  . Right anterior knee pain 03/31/2021  . Adjustment disorder with mixed anxiety and depressed mood 03/10/2021  . Cervical dysplasia 01/21/2019  . Presence of subcutaneous contraceptive implant 09/12/2018  . AR (allergic rhinitis) 04/01/2014   Current Meds  Medication Sig  . amoxicillin-clavulanate (AUGMENTIN) 400-57 MG/5ML suspension Take 10.9 mLs (875 mg total) by mouth 2 (two) times daily for 10 days.  Marland Kitchen escitalopram  (LEXAPRO) 10 MG tablet TAKE 1 TABLET BY MOUTH EVERY DAY  . UNABLE TO FIND Med Name: Herbal Swiss    Allergies: Patient has No Known Allergies. Family history:  Patient family history includes Asthma in her brother; Healthy in her daughter, mother, son, and son; Hyperlipidemia in her father. Social History   Socioeconomic History  . Marital status: Single    Spouse name: lives with boyfriend  . Number of children: 3  . Years of education: Not on file  . Highest education level: Not on file  Occupational History  . Occupation: dental hygeinist    Employer: [employer]  Tobacco Use  . Smoking status: Never Smoker  . Smokeless tobacco: Never Used  Vaping Use  . Vaping Use: Never used  Substance and Sexual Activity  . Alcohol use: No  . Drug use: No  . Sexual activity: Yes    Birth control/protection: Implant  Other Topics Concern  . Not on file  Social History Narrative  . Not on file   Social Determinants of Health   Financial Resource Strain: Not on file  Food Insecurity: Not on file  Transportation Needs: Not on file  Physical Activity: Not on file  Stress: Not on file  Social Connections: Not on file     Review of Systems: Constitutional: Negative for fever malaise or anorexia Cardiovascular: negative for chest pain Respiratory: negative for SOB or persistent cough Gastrointestinal: negative for abdominal pain  Objective  Vitals: BP 124/78   Pulse 84   Temp 98.6 F (37 C) (Temporal)   Ht 5\' 2"  (1.575 m)   Wt 159 lb 3.2 oz (72.2 kg)   LMP 04/05/2021   SpO2 99%   BMI 29.12 kg/m  General: no acute distress, well appearing, no apparent distress, well groomed Psych:  Alert and oriented x 3,normal mood, behavior, speech, dress, and thought processes.  HEENT: Mild parotid swelling palpable with minimal tenderness, no erythema or warmth.  No fluctuance.  No salivary stone palpated appears comfortable Right knee: Full range of motion without  crepitus    Commons side effects, risks, benefits, and alternatives for medications and treatment plan prescribed today were discussed, and the patient expressed understanding of the given instructions. Patient is instructed to call or message via MyChart if he/she has any questions or concerns regarding our treatment plan. No barriers to understanding were identified. We discussed Red Flag symptoms and signs in detail. Patient expressed understanding regarding what to do in case of urgent or emergency type symptoms.   Medication list was reconciled, printed and provided to the patient in AVS. Patient instructions and summary information was reviewed with the patient as documented in the AVS. This note was prepared with assistance of Dragon voice recognition software. Occasional wrong-word or sound-a-like substitutions may have occurred due to the inherent limitations of voice recognition software

## 2021-05-05 ENCOUNTER — Encounter: Payer: 59 | Admitting: Physical Therapy

## 2021-05-05 ENCOUNTER — Telehealth: Payer: Self-pay | Admitting: Physical Therapy

## 2021-05-05 NOTE — Telephone Encounter (Signed)
Pt forgot about PT appt today, but spoke with her and she reports compliance with HEP and decreased pain overall.  Will cancel next PT appt as she's doing well, and she will call if needed.  Clarita Crane, PT, DPT 05/05/21 1:23 PM

## 2021-05-12 ENCOUNTER — Encounter: Payer: Self-pay | Admitting: Physical Therapy

## 2021-05-12 ENCOUNTER — Ambulatory Visit: Payer: 59 | Admitting: Family Medicine

## 2021-05-19 ENCOUNTER — Ambulatory Visit: Payer: 59 | Admitting: Family Medicine

## 2021-05-19 NOTE — Progress Notes (Deleted)
   I, Philbert Riser, LAT, ATC acting as a scribe for [name], MD.  Natividad Schlosser is a 35 y.o. female who presents to [facility] today for f/u R knee pain. Pt was last seen by Dr. Denyse Amass on 03/31/21 and was referred to PT and has completed 1 visit (and 2 no-shows). Today, pt reports  Dx imaging: 03/10/21 R knee XR  Pertinent review of systems: ***  Relevant historical information: ***   Exam:  There were no vitals taken for this visit. General: Well Developed, well nourished, and in no acute distress.   MSK:     Lab and Radiology Results No results found for this or any previous visit (from the past 72 hour(s)). No results found.     Assessment and Plan: 35 y.o. female with ***   PDMP not reviewed this encounter. No orders of the defined types were placed in this encounter.  No orders of the defined types were placed in this encounter.    Discussed warning signs or symptoms. Please see discharge instructions. Patient expresses understanding.

## 2021-07-10 LAB — HM PAP SMEAR: HPV 16/18/45 genotyping: NEGATIVE

## 2021-08-04 ENCOUNTER — Ambulatory Visit: Payer: 59 | Admitting: Family Medicine

## 2021-09-15 ENCOUNTER — Other Ambulatory Visit: Payer: Self-pay

## 2021-09-15 ENCOUNTER — Ambulatory Visit (INDEPENDENT_AMBULATORY_CARE_PROVIDER_SITE_OTHER): Payer: 59 | Admitting: Family Medicine

## 2021-09-15 ENCOUNTER — Encounter: Payer: Self-pay | Admitting: Family Medicine

## 2021-09-15 VITALS — BP 124/86 | HR 77 | Temp 98.4°F | Ht 62.0 in | Wt 164.0 lb

## 2021-09-15 DIAGNOSIS — F321 Major depressive disorder, single episode, moderate: Secondary | ICD-10-CM

## 2021-09-15 DIAGNOSIS — Z23 Encounter for immunization: Secondary | ICD-10-CM

## 2021-09-15 MED ORDER — BUPROPION HCL ER (XL) 150 MG PO TB24: 150.0000 mg | ORAL_TABLET | Freq: Every day | ORAL | 2 refills | Status: DC

## 2021-09-15 NOTE — Patient Instructions (Signed)
Please return in 6 weeks to recheck mood.   Please add wellbutrin to your lexapro.   If you have any questions or concerns, please don't hesitate to send me a message via MyChart or call the office at [phone]. Thank you for visiting with [place] today! It's our pleasure caring for you.   Major Depressive Disorder, Adult Major depressive disorder (MDD) is a mental health condition. It may also be called clinical depression or unipolar depression. MDD causes symptoms of sadness, hopelessness, and loss of interest in things. These symptoms last most of the day, almost every day, for 2 weeks. MDD can also cause physical symptoms. It can interfere with relationships and with everyday activities, such as work, school, and activities that are usually pleasant. MDD may be mild, moderate, or severe. It may be single-episode MDD, which happens once, or recurrent MDD, which may occur multiple times. What are the causes? The exact cause of this condition is not known. MDD is most likely caused by a combination of things, which may include: Your personality traits. Learned or conditioned behaviors or thoughts or feelings that reinforce negativity. Any alcohol or substance misuse. Long-term (chronic) physical or mental health illness. Going through a traumatic experience or major life changes. What increases the risk? The following factors may make someone more likely to develop MDD: A family history of depression. Being a woman. Troubled family relationships. Abnormally low levels of certain brain chemicals. Traumatic or painful events in childhood, especially abuse or loss of a parent. A lot of stress from life experiences, such as poor living conditions or discrimination. Chronic physical illness or other mental health disorders. What are the signs or symptoms? The main symptoms of MDD usually include: Constant depressed or irritable mood. A loss of interest in things and activities. Other symptoms  include: Sleeping or eating too much or too little. Unexplained weight gain or weight loss. Tiredness or low energy. Being agitated, restless, or weak. Feeling hopeless, worthless, or guilty. Trouble thinking clearly or making decisions. Thoughts of suicide or thoughts of harming others. Isolating oneself or avoiding other people or activities. Trouble completing tasks, work, or any normal obligations. Severe symptoms of this condition may include: Psychotic depression.This may include false beliefs, or delusions. It may also include seeing, hearing, tasting, smelling, or feeling things that are not real (hallucinations). Chronic depression or persistent depressive disorder. This is low-level depression that lasts for at least 2 years. Melancholic depression, or feeling extremely sad and hopeless. Catatonic depression, which includes trouble speaking and trouble moving. How is this diagnosed? This condition may be diagnosed based on: Your symptoms. Your medical and mental health history. You may be asked questions about your lifestyle, including any drug and alcohol use. A physical exam. Blood tests to rule out other conditions. MDD is confirmed if you have the following symptoms most of the day, nearly every day, in a 2-week period: Either a depressed mood or loss of interest. At least four other MDD symptoms. How is this treated? This condition is usually treated by mental health professionals, such as psychologists, psychiatrists, and clinical social workers. You may need more than one type of treatment. Treatment may include: Psychotherapy, also called talk therapy or counseling. Types of psychotherapy include: Cognitive behavioral therapy (CBT). This teaches you to recognize unhealthy feelings, thoughts, and behaviors, and replace them with positive thoughts and actions. Interpersonal therapy (IPT). This helps you to improve the way you communicate with others or relate to  them. Family therapy. This treatment  includes members of your family. Medicines to treat anxiety and depression. These medicines help to balance the brain chemicals that affect your emotions. Lifestyle changes. You may be asked to: Limit alcohol use and avoid drug use. Get regular exercise. Get plenty of sleep. Make healthy eating choices. Spend more time outdoors. Brain stimulation. This may be done if symptoms are very severe and other treatments have not worked. Examples of this treatment are electroconvulsive therapy and transcranial magnetic stimulation. Follow these instructions at home: Activity Exercise regularly and spend time outdoors. Find activities that you enjoy doing, and make time to do them. Find healthy ways to manage stress, such as: Meditation or deep breathing. Spending time in nature. Journaling. Return to your normal activities as told by your health care provider. Ask your health care provider what activities are safe for you. Alcohol and drug use If you drink alcohol: Limit how much you use to: 0-1 drink a day for women who are not pregnant. 0-2 drinks a day for men. Be aware of how much alcohol is in your drink. In the [place] one drink equals one 12 oz bottle of beer (355 mL), one 5 oz glass of wine (148 mL), or one 1 oz glass of hard liquor (44 mL). Discuss your alcohol use with your health care provider. Alcohol can affect any antidepressant medicines you are taking. Discuss any drug use with your health care provider. General instructions  Take over-the-counter and prescription medicines only as told by your health care provider. Eat a healthy diet and get plenty of sleep. Consider joining a support group. Your health care provider may be able to recommend one. Keep all follow-up visits as told by your health care provider. This is important. Where to find more information [employer] on Mental Illness: www.nami.org [place] [employer] of  Mental Health: http://www.maynard.net/ Contact a health care provider if: Your symptoms get worse. You develop new symptoms. Get help right away if: You self-harm. You have serious thoughts about hurting yourself or others. You hallucinate. If you ever feel like you may hurt yourself or others, or have thoughts about taking your own life, get help right away. Go to your nearest emergency department or: Call your local emergency services (911 in the [place] Call a suicide crisis helpline, such as the National Suicide Prevention Lifeline at (289)044-4065. This is open 24 hours a day in the [place] Text the Crisis Text Line at (782)501-6350 (in the [place] Summary Major depressive disorder (MDD) is a mental health condition. MDD causes symptoms of sadness, hopelessness, and loss of interest in things. These symptoms last most of the day, almost every day, for 2 weeks. The symptoms of MDD can interfere with relationships and with everyday activities. Treatments and support are available for people who develop MDD. You may need more than one type of treatment. Get help right away if you have serious thoughts about hurting yourself or others. This information is not intended to replace advice given to you by your health care provider. Make sure you discuss any questions you have with your health care provider. Document Revised: 11/02/2019 Document Reviewed: 11/02/2019 Elsevier Patient Education  2022 [employer].

## 2021-09-15 NOTE — Progress Notes (Signed)
Subjective  CC:  Chief Complaint  Patient presents with   Anxiety    Medication has been helping slightly   Depression    HPI: [facility] is a 35 y.o. female who presents to the office today to address the problems listed above in the chief complaint, mood problems. 35 yo w/ MDD on lexapro since may: doing poorly. Current symptoms include depressed mood, anhedonia, psychomotor retardation, fatigue, difficulty concentrating, impaired memory,  and have been present and progressive for several mnths. These symptoms have been progressive in nature. Her boss at work has noticed: she is showing up late for work and not completing job responsibilities. She denies current suicidal or homicidal plan or intent. No known triggers or stressors. Home life is good: partner is helping her with the responsibilities of caring for the 3 kids.   Depression screen [facility] 2/9 09/15/2021 04/21/2021 03/10/2021  Decreased Interest 3 0 3  Down, Depressed, Hopeless 0 0 0  PHQ - 2 Score 3 0 3  Altered sleeping 2 1 1   Tired, decreased energy 3 1 0  Change in appetite 0 0 0  Feeling bad or failure about yourself  1 2 0  Trouble concentrating 3 2 3   Moving slowly or fidgety/restless 0 0 0  Suicidal thoughts 0 0 0  PHQ-9 Score 12 6 7   Difficult doing work/chores Extremely dIfficult Somewhat difficult Somewhat difficult   GAD 7 : Generalized Anxiety Score 09/15/2021 04/21/2021 03/10/2021  Nervous, Anxious, on Edge 3 1 3   Control/stop worrying 3 1 3   Worry too much - different things 3 1 3   Trouble relaxing 3 0 2  Restless 3 0 0  Easily annoyed or irritable 3 0 3  Afraid - awful might happen 3 0 3  Total GAD 7 Score 21 3 17   Anxiety Difficulty Extremely difficult Not difficult at all Extremely difficult     Assessment  1. Depression, major, single episode, moderate (HCC)      Plan  Depression:  worsening. Continue lexapro since good initial response and add wellbutrin. Recheck 6 weeks. Change to  prozac if not improving. Patient understands and agrees with care plan.   Reviewed concept of mood problems caused by biochemical imbalance of neurotransmitters and rationale for treatment with medications and therapy.  Counseling given: pt was instructed to contact office, on-call physician or crisis Hotline if symptoms worsen significantly. If patient develops any suicidal or homicidal thoughts, she is directed to the ER immediately.   Follow up: 6 weeks  No orders of the defined types were placed in this encounter.  Meds ordered this encounter  Medications   buPROPion (WELLBUTRIN XL) 150 MG 24 hr tablet    Sig: Take 1 tablet (150 mg total) by mouth daily.    Dispense:  30 tablet    Refill:  2       I reviewed the patients updated PMH, FH, and SocHx.    Patient Active Problem List   Diagnosis Date Noted   Depression, major, single episode, moderate (HCC) 09/15/2021   Patellofemoral chondrosis of right knee 03/31/2021   Right anterior knee pain 03/31/2021   Adjustment disorder with mixed anxiety and depressed mood 03/10/2021   Cervical dysplasia 01/21/2019   Presence of subcutaneous contraceptive implant 09/12/2018   AR (allergic rhinitis) 04/01/2014   Current Meds  Medication Sig   buPROPion (WELLBUTRIN XL) 150 MG 24 hr tablet Take 1 tablet (150 mg total) by mouth daily.   escitalopram (LEXAPRO) 10 MG  tablet TAKE 1 TABLET BY MOUTH EVERY DAY   UNABLE TO FIND Med Name: Herbal Swiss    Allergies: Patient has No Known Allergies. Family history:  Patient family history includes Asthma in her brother; Healthy in her daughter, mother, son, and son; Hyperlipidemia in her father. Social History   Socioeconomic History   Marital status: Single    Spouse name: lives with boyfriend   Number of children: 3   Years of education: Not on file   Highest education level: Not on file  Occupational History   Occupation: dental hygeinist    Employer: [employer]  Tobacco Use    Smoking status: Never   Smokeless tobacco: Never  Vaping Use   Vaping Use: Never used  Substance and Sexual Activity   Alcohol use: No   Drug use: No   Sexual activity: Yes    Birth control/protection: Implant  Other Topics Concern   Not on file  Social History Narrative   Not on file   Social Determinants of Health   Financial Resource Strain: Not on file  Food Insecurity: Not on file  Transportation Needs: Not on file  Physical Activity: Not on file  Stress: Not on file  Social Connections: Not on file     Review of Systems: Constitutional: Negative for fever malaise or anorexia Cardiovascular: negative for chest pain Respiratory: negative for SOB or persistent cough Gastrointestinal: negative for abdominal pain  Objective  Vitals: BP 124/86   Pulse 77   Temp 98.4 F (36.9 C) (Temporal)   Ht 5\' 2"  (1.575 m)   Wt 164 lb (74.4 kg)   LMP 09/10/2021 (Exact Date)   SpO2 97%   BMI 30.00 kg/m  General: no acute distress, well appearing, no apparent distress, well groomed Psych:  Alert and oriented x 3,depressed. depressed affect and hypokinetic, good grooming. Good eye contact. Nl speech    Commons side effects, risks, benefits, and alternatives for medications and treatment plan prescribed today were discussed, and the patient expressed understanding of the given instructions. Patient is instructed to call or message via MyChart if he/she has any questions or concerns regarding our treatment plan. No barriers to understanding were identified. We discussed Red Flag symptoms and signs in detail. Patient expressed understanding regarding what to do in case of urgent or emergency type symptoms.  Medication list was reconciled, printed and provided to the patient in AVS. Patient instructions and summary information was reviewed with the patient as documented in the AVS. This note was prepared with assistance of Dragon voice recognition software. Occasional wrong-word or  sound-a-like substitutions may have occurred due to the inherent limitations of voice recognition software

## 2021-10-07 ENCOUNTER — Other Ambulatory Visit: Payer: Self-pay | Admitting: Family Medicine

## 2021-10-13 ENCOUNTER — Ambulatory Visit: Payer: 59 | Admitting: Family Medicine

## 2021-10-27 ENCOUNTER — Ambulatory Visit: Payer: 59 | Admitting: Family Medicine

## 2021-10-27 ENCOUNTER — Other Ambulatory Visit: Payer: Self-pay

## 2021-10-27 ENCOUNTER — Encounter: Payer: Self-pay | Admitting: Family Medicine

## 2021-10-27 VITALS — BP 110/78 | HR 68 | Temp 98.1°F | Ht 62.0 in | Wt 164.8 lb

## 2021-10-27 DIAGNOSIS — F4323 Adjustment disorder with mixed anxiety and depressed mood: Secondary | ICD-10-CM

## 2021-10-27 DIAGNOSIS — F321 Major depressive disorder, single episode, moderate: Secondary | ICD-10-CM | POA: Diagnosis not present

## 2021-10-27 MED ORDER — BUPROPION HCL ER (XL) 300 MG PO TB24: 300.0000 mg | ORAL_TABLET | Freq: Every day | ORAL | 1 refills | Status: DC

## 2021-10-27 NOTE — Progress Notes (Signed)
Subjective  CC:  Chief Complaint  Patient presents with   Depression    HPI: [facility] is a 35 y.o. female who presents to the office today to address the problems listed above in the chief complaint, mood problems. 6 weeks recheck after adding wellbutrin xl 150mg  to lexapro 10; she reports she is feeling much better overall. Her mood has lifted as well as her motivation is improved. She continues to struggle with anxiety sxs listed belwo and has trouble with focus. No h/o focus problems or dx of ADD (although her youngest will be evaluated soon for this). She denies SE from meds. Reports her mind will jump from one task to another and she can't complete tasks, she will worry about getting things done. She works and is a busy mother.   Depression screen [facility] 2/9 10/27/2021 09/15/2021 04/21/2021  Decreased Interest 2 3 0  Down, Depressed, Hopeless 0 0 0  PHQ - 2 Score 2 3 0  Altered sleeping 2 2 1   Tired, decreased energy 2 3 1   Change in appetite 0 0 0  Feeling bad or failure about yourself  0 1 2  Trouble concentrating 3 3 2   Moving slowly or fidgety/restless 0 0 0  Suicidal thoughts 0 0 0  PHQ-9 Score 9 12 6   Difficult doing work/chores Somewhat difficult Extremely dIfficult Somewhat difficult   GAD 7 : Generalized Anxiety Score 09/15/2021 04/21/2021 03/10/2021  Nervous, Anxious, on Edge 3 1 3   Control/stop worrying 3 1 3   Worry too much - different things 3 1 3   Trouble relaxing 3 0 2  Restless 3 0 0  Easily annoyed or irritable 3 0 3  Afraid - awful might happen 3 0 3  Total GAD 7 Score 21 3 17   Anxiety Difficulty Extremely difficult Not difficult at all Extremely difficult     Assessment  1. Depression, major, single episode, moderate (HCC)   2. Adjustment disorder with mixed anxiety and depressed mood      Plan  Depression/anxiety:  improving but not yet controlled. Increase wellbutrin XL 300 daily. Continue lexapro. Start behavioral strategies to help with  mindfulness/present and focus. Recheck 2 months.  Reviewed concept of mood problems caused by biochemical imbalance of neurotransmitters and rationale for treatment with medications and therapy.  Counseling given: pt was instructed to contact office, on-call physician or crisis Hotline if symptoms worsen significantly. If patient develops any suicidal or homicidal thoughts, she is directed to the ER immediately.   Follow up: 2 mo for mood recheck  No orders of the defined types were placed in this encounter.  Meds ordered this encounter  Medications   buPROPion (WELLBUTRIN XL) 300 MG 24 hr tablet    Sig: Take 1 tablet (300 mg total) by mouth daily.    Dispense:  90 tablet    Refill:  1      I reviewed the patients updated PMH, FH, and SocHx.    Patient Active Problem List   Diagnosis Date Noted   Depression, major, single episode, moderate (HCC) 09/15/2021   Patellofemoral chondrosis of right knee 03/31/2021   Right anterior knee pain 03/31/2021   Adjustment disorder with mixed anxiety and depressed mood 03/10/2021   Cervical dysplasia 01/21/2019   Presence of subcutaneous contraceptive implant 09/12/2018   AR (allergic rhinitis) 04/01/2014   Current Meds  Medication Sig   escitalopram (LEXAPRO) 10 MG tablet TAKE 1 TABLET BY MOUTH EVERY DAY   UNABLE TO FIND Med Name:  Herbal Swiss   [DISCONTINUED] buPROPion (WELLBUTRIN XL) 150 MG 24 hr tablet TAKE 1 TABLET BY MOUTH EVERY DAY    Allergies: Patient has No Known Allergies. Family history:  Patient family history includes Asthma in her brother; Healthy in her daughter, mother, son, and son; Hyperlipidemia in her father. Social History   Socioeconomic History   Marital status: Single    Spouse name: lives with boyfriend   Number of children: 3   Years of education: Not on file   Highest education level: Not on file  Occupational History   Occupation: dental hygeinist    Employer: [employer]  Tobacco Use   Smoking status:  Never   Smokeless tobacco: Never  Vaping Use   Vaping Use: Never used  Substance and Sexual Activity   Alcohol use: No   Drug use: No   Sexual activity: Yes    Birth control/protection: Implant  Other Topics Concern   Not on file  Social History Narrative   Not on file   Social Determinants of Health   Financial Resource Strain: Not on file  Food Insecurity: Not on file  Transportation Needs: Not on file  Physical Activity: Not on file  Stress: Not on file  Social Connections: Not on file     Review of Systems: Constitutional: Negative for fever malaise or anorexia Cardiovascular: negative for chest pain Respiratory: negative for SOB or persistent cough Gastrointestinal: negative for abdominal pain  Objective  Vitals: BP 110/78   Pulse 68   Temp 98.1 F (36.7 C) (Temporal)   Ht 5\' 2"  (1.575 m)   Wt 164 lb 12.8 oz (74.8 kg)   LMP 09/23/2021 (Exact Date)   SpO2 96%   BMI 30.14 kg/m  General: no acute distress, well appearing, no apparent distress, well groomed Psych:  Alert and oriented x 3,normal mood, behavior, speech, dress, and thought processes. Looks brighter today    Commons side effects, risks, benefits, and alternatives for medications and treatment plan prescribed today were discussed, and the patient expressed understanding of the given instructions. Patient is instructed to call or message via MyChart if he/she has any questions or concerns regarding our treatment plan. No barriers to understanding were identified. We discussed Red Flag symptoms and signs in detail. Patient expressed understanding regarding what to do in case of urgent or emergency type symptoms.  Medication list was reconciled, printed and provided to the patient in AVS. Patient instructions and summary information was reviewed with the patient as documented in the AVS. This note was prepared with assistance of Dragon voice recognition software. Occasional wrong-word or sound-a-like  substitutions may have occurred due to the inherent limitations of voice recognition software

## 2021-10-27 NOTE — Patient Instructions (Signed)
Please return in 2 months for mood recheck.   We are increasing your wellbutrin XL to 300mg  daily. This should help. Continue the lexapro 10mg  daily.   If you have any questions or concerns, please don't hesitate to send me a message via MyChart or call the office at [phone]. Thank you for visiting with today! It's our pleasure caring for you.

## 2021-11-02 ENCOUNTER — Encounter: Payer: Self-pay | Admitting: Physician Assistant

## 2021-11-02 ENCOUNTER — Other Ambulatory Visit: Payer: Self-pay

## 2021-11-02 ENCOUNTER — Ambulatory Visit: Payer: 59 | Admitting: Physician Assistant

## 2021-11-02 VITALS — BP 110/66 | HR 71 | Temp 98.3°F | Ht 62.0 in | Wt 164.4 lb

## 2021-11-02 DIAGNOSIS — L259 Unspecified contact dermatitis, unspecified cause: Secondary | ICD-10-CM

## 2021-11-02 MED ORDER — PREDNISONE 10 MG PO TABS: ORAL_TABLET | ORAL | 0 refills | Status: DC

## 2021-11-02 NOTE — Progress Notes (Signed)
[facility] is a 35 y.o. female here for a rash.  History of Present Illness:   Chief Complaint  Patient presents with   Rash    Pt c/o rash on neck, chest and both arms, little on left side of face next to eye. Pt used neosporin and Aquaphor.    HPI  [facility] May presented to today's visit with her Fiance.   Rash Ms. Carol May presents with c/o a rash that has been located on her neck, chest bilateral arms, and slightly beside her left eye. This began four days ago following her attending an event at the [facility] and has gotten worse over time. According to [facility], she experienced the same type of rash on her left forearm a month ago but stated it went away on its own.   Currently she describes the rash to be extremely itchy. In an effort to treat her rash, she has used neosporin and aquaphor but was provided with no relief. Denies environmental changes, hx of eczema, medication changes, hx of seasonal allergies, swelling, or recent hygenic changes, lip/tongue swelling/itching.  Past Medical History:  Diagnosis Date   AR (allergic rhinitis)    Cervical dysplasia 01/21/2019   CIN 1-2; s/p LEEP 01/2019 Dr. Hinton Rao   H/O varicella      Social History   Tobacco Use   Smoking status: Never   Smokeless tobacco: Never  Vaping Use   Vaping Use: Never used  Substance Use Topics   Alcohol use: No   Drug use: No    Past Surgical History:  Procedure Laterality Date   COLPOSCOPY  12/19/2018   OPEN REDUCTION INTERNAL FIXATION (ORIF) TIBIA/FIBULA FRACTURE Right 08/11/2016   Procedure: OPEN REDUCTION INTERNAL FIXATION (ORIF) RIGHT TIBIA/FIBULA  PILON FRACTURE;  Surgeon: Toni Arthurs, MD;  Location: [facility];  Service: Orthopedics;  Laterality: Right;    Family History  Problem Relation Age of Onset   Hyperlipidemia Father    Asthma Brother    Healthy Daughter    Healthy Son    Healthy Son    Healthy Mother     No Known Allergies  Current Medications:    Current Outpatient Medications:    Boric Acid Vaginal 600 MG SUPP, insert ONE capsule vaginally AT bedtime FOR SEVEN DAYS, THEN USE TWO TO THREE times a WEEK., Disp: , Rfl:    buPROPion (WELLBUTRIN XL) 300 MG 24 hr tablet, Take 1 tablet (300 mg total) by mouth daily., Disp: 90 tablet, Rfl: 1   escitalopram (LEXAPRO) 10 MG tablet, TAKE 1 TABLET BY MOUTH EVERY DAY, Disp: 90 tablet, Rfl: 1   UNABLE TO FIND, Med Name: Herbal Swiss, Disp: , Rfl:    Review of Systems:   ROS Negative unless otherwise specified per HPI. Vitals:   Vitals:   11/02/21 1558  BP: 110/66  Pulse: 71  Temp: 98.3 F (36.8 C)  TempSrc: Temporal  SpO2: 98%  Weight: 164 lb 6.1 oz (74.6 kg)  Height: 5\' 2"  (1.575 m)     Body mass index is 30.07 kg/m.  Physical Exam:   Physical Exam Constitutional:      Appearance: Normal appearance. She is well-developed.  HENT:     Head: Normocephalic and atraumatic.  Eyes:     General: Lids are normal.     Extraocular Movements: Extraocular movements intact.     Conjunctiva/sclera: Conjunctivae normal.  Pulmonary:     Effort: Pulmonary effort is normal.  Musculoskeletal:        General: Normal range  of motion.     Cervical back: Normal range of motion and neck supple.  Skin:    General: Skin is warm and dry.     Findings: Rash present.     Comments: Diffuse erythematous sand-paper like rash to anterior left forearm, right forearm and upper chest   Neurological:     Mental Status: She is alert and oriented to person, place, and time.  Psychiatric:        Attention and Perception: Attention and perception normal.        Mood and Affect: Mood normal.        Behavior: Behavior normal.        Thought Content: Thought content normal.        Judgment: Judgment normal.    Assessment and Plan:   Contact Dermatitis  No red flags Start prednisone 20 mg daily x 1 week , then 10 mg daily x week Start daily antihistamine of choice, may take additional benadryl as  needed at night for breakthrough itching Worsening precautions advised Follow up if symptoms worsen or concerns occur   I,Havlyn C Ratchford,acting as a scribe for [employer], PA.,have documented all relevant documentation on the behalf of Jarold Motto, PA,as directed by  Jarold Motto, PA while in the presence of Jarold Motto, [place].  I, Jarold Motto, [place], have reviewed all documentation for this visit. The documentation on 11/02/21 for the exam, diagnosis, procedures, and orders are all accurate and complete.   Jarold Motto, PA-C

## 2021-11-02 NOTE — Patient Instructions (Signed)
It was great to see you!  Start daily over the counter antihistamines such as Zyrtec (cetirizine), Claritin (loratadine), Allegra (fexofenadine), or Xyzal (levocetirizine) daily x 1 - 2 weeks. May also take 25 mg benadryl at night for itching in addition to this.   Start oral prednisone -- take 20 mg daily x 1 week then 10 mg daily x 1 week.  Ice packs as needed for itching.  Let us know if any concerns.  Take care,  Jarold Motto PA-C

## 2022-01-13 ENCOUNTER — Telehealth: Payer: Self-pay | Admitting: Family Medicine

## 2022-01-13 NOTE — Telephone Encounter (Signed)
Pt is asking to see Dr Jonni Sanger for her ongoing knee issue. She stated she is now limping. There is an opening on 02/07/22 but she is asking to be seen sooner. Would Dr Jonni Sanger be willing to use a Same Day appt for her? Please advise

## 2022-01-14 NOTE — Telephone Encounter (Signed)
Called pt and left VM to call office to schedule

## 2022-02-02 ENCOUNTER — Ambulatory Visit (INDEPENDENT_AMBULATORY_CARE_PROVIDER_SITE_OTHER): Payer: Managed Care, Other (non HMO) | Admitting: Family Medicine

## 2022-02-02 ENCOUNTER — Encounter: Payer: Self-pay | Admitting: Family Medicine

## 2022-02-02 ENCOUNTER — Other Ambulatory Visit: Payer: Self-pay

## 2022-02-02 VITALS — BP 116/70 | HR 81 | Temp 98.3°F | Ht 62.0 in | Wt 167.2 lb

## 2022-02-02 DIAGNOSIS — M2241 Chondromalacia patellae, right knee: Secondary | ICD-10-CM | POA: Diagnosis not present

## 2022-02-02 DIAGNOSIS — F321 Major depressive disorder, single episode, moderate: Secondary | ICD-10-CM | POA: Diagnosis not present

## 2022-02-02 DIAGNOSIS — M25561 Pain in right knee: Secondary | ICD-10-CM | POA: Diagnosis not present

## 2022-02-02 NOTE — Progress Notes (Signed)
? ?Subjective  ?CC:  ?Chief Complaint  ?Patient presents with  ? Knee Pain  ?  Right, Sharp pain on Top/outside of the knee.   ? ? ?HPI: Carol May is a 36 y.o. female who presents to the office today to address the problems listed above in the chief complaint. ?36 year old with depression.  First evaluated last fall.  Started Lexapro but did not have significant improvement so added Wellbutrin November.  However after 4 doses, she stopped the Lexapro due to nausea.  She feels that if she takes both medications together, she is mildly nauseous.  She tried with food without any benefit.  However when she takes them separately she feels fine.  Currently taking Wellbutrin 300 mg daily and feels that her mood is significantly improved.  However still with some anxiety.  She does have a new job and is doing well there.  Still with some problems with focus however, motivation and her ability to take care of the kids and perform better he has improved.  She is much happier.  No other adverse effects. ?Has long history of chronic knee pain.  Was evaluated by sports medicine back in April 2022.  She did complete physical therapy.  She reported that the exercises helped initially but now pain is back.  Using Advil frequently.  No new symptoms.  Pain is worse medially.  She has had x-rays and an ultrasound.  No new injuries reviewed sports medicine notes ? ?Assessment  ?1. Depression, major, single episode, moderate (HCC)   ?2. Patellofemoral chondrosis of right knee   ?3. Right anterior knee pain   ? ?  ?Plan  ?Depression: Improving.  I recommend taking Lexapro in the morning and Wellbutrin at night.  Recheck in 4 to 6 weeks.  She is much better, hopefully can get back to her normal self. ?Chronic knee pain, patellofemoral syndrome: Recommend restarting home physical therapy exercises.  Recommend follow-up with sports medicine for further evaluation or treatment plans.  Patient agrees to make appointment. ? ?Follow up:  As scheduled for complete physical and follow-up mood ?03/30/2022 ? ?No orders of the defined types were placed in this encounter. ? ?No orders of the defined types were placed in this encounter. ? ?  ? ?I reviewed the patients updated PMH, FH, and SocHx.  ?  ?Patient Active Problem List  ? Diagnosis Date Noted  ? Depression, major, single episode, moderate (HCC) 09/15/2021  ? Patellofemoral chondrosis of right knee 03/31/2021  ? Right anterior knee pain 03/31/2021  ? Adjustment disorder with mixed anxiety and depressed mood 03/10/2021  ? Cervical dysplasia 01/21/2019  ? Presence of subcutaneous contraceptive implant 09/12/2018  ? AR (allergic rhinitis) 04/01/2014  ? ?Current Meds  ?Medication Sig  ? Boric Acid Vaginal 600 MG SUPP insert ONE capsule vaginally AT bedtime FOR SEVEN DAYS, THEN USE TWO TO THREE times a WEEK.  ? buPROPion (WELLBUTRIN XL) 300 MG 24 hr tablet Take 1 tablet (300 mg total) by mouth daily.  ? escitalopram (LEXAPRO) 10 MG tablet TAKE 1 TABLET BY MOUTH EVERY DAY  ? UNABLE TO FIND Med Name: Herbal Swiss  ? [DISCONTINUED] predniSONE (DELTASONE) 10 MG tablet Take two tablets daily x 1 week and then one tablet daily x 1 week  ? ? ?Allergies: ?Patient has No Known Allergies. ?Family History: ?Patient family history includes Asthma in her brother; Healthy in her daughter, mother, son, and son; Hyperlipidemia in her father. ?Social History:  ?Patient  reports that she has never  smoked. She has never used smokeless tobacco. She reports that she does not drink alcohol and does not use drugs. ? ?Review of Systems: ?Constitutional: Negative for fever malaise or anorexia ?Cardiovascular: negative for chest pain ?Respiratory: negative for SOB or persistent cough ?Gastrointestinal: negative for abdominal pain ? ?Objective  ?Vitals: BP 116/70   Pulse 81   Temp 98.3 ?F (36.8 ?C) (Temporal)   Ht 5\' 2"  (1.575 m)   Wt 167 lb 3.2 oz (75.8 kg)   LMP 12/22/2021 (Approximate)   SpO2 98%   BMI 30.58 kg/m?   ?General: no acute distress , A&Ox3 ?Psych: Smiling, normal affect today. ?Normal gait ? ? ? ?Commons side effects, risks, benefits, and alternatives for medications and treatment plan prescribed today were discussed, and the patient expressed understanding of the given instructions. Patient is instructed to call or message via MyChart if he/she has any questions or concerns regarding our treatment plan. No barriers to understanding were identified. We discussed Red Flag symptoms and signs in detail. Patient expressed understanding regarding what to do in case of urgent or emergency type symptoms.  ?Medication list was reconciled, printed and provided to the patient in AVS. Patient instructions and summary information was reviewed with the patient as documented in the AVS. ?This note was prepared with assistance of 12/24/2021. Occasional wrong-word or sound-a-like substitutions may have occurred due to the inherent limitations of voice recognition software ? ?This visit occurred during the SARS-CoV-2 public health emergency.  Safety protocols were in place, including screening questions prior to the visit, additional usage of staff PPE, and extensive cleaning of exam room while observing appropriate contact time as indicated for disinfecting solutions.  ? ?

## 2022-02-02 NOTE — Patient Instructions (Addendum)
Please follow up as scheduled for your next visit with me: 03/30/2022  ? ?If you have any questions or concerns, please don't hesitate to send me a message via MyChart or call the office at [phone]. Thank you for visiting with [place] today! It's our pleasure caring for you.  ? ?Please call Dr. Zollie Pee office to get back with him to help with your knee pain. Restart your exercises.  ?[phone]  ? ?

## 2022-02-09 ENCOUNTER — Telehealth: Payer: Self-pay | Admitting: Family Medicine

## 2022-02-09 ENCOUNTER — Other Ambulatory Visit: Payer: Self-pay

## 2022-02-09 ENCOUNTER — Ambulatory Visit (INDEPENDENT_AMBULATORY_CARE_PROVIDER_SITE_OTHER): Payer: Managed Care, Other (non HMO) | Admitting: Family Medicine

## 2022-02-09 ENCOUNTER — Ambulatory Visit: Payer: Self-pay

## 2022-02-09 VITALS — BP 130/84 | HR 75 | Ht 62.0 in | Wt 167.0 lb

## 2022-02-09 DIAGNOSIS — M25561 Pain in right knee: Secondary | ICD-10-CM

## 2022-02-09 DIAGNOSIS — M2241 Chondromalacia patellae, right knee: Secondary | ICD-10-CM

## 2022-02-09 DIAGNOSIS — M25562 Pain in left knee: Secondary | ICD-10-CM

## 2022-02-09 DIAGNOSIS — G8929 Other chronic pain: Secondary | ICD-10-CM

## 2022-02-09 NOTE — Telephone Encounter (Signed)
Patient is still having knee pain - would like to be referred to a specialist. Patient has an upcoming apt on  03/30/22 with Dr Mardelle Matte-  ?

## 2022-02-09 NOTE — Progress Notes (Signed)
? ?I, Philbert Riser, LAT, ATC acting as a scribe for [name], MD. ? ?Carol May is a 36 y.o. female who presents to [facility] at [facility] today for continued R knee pain. Pt was last seen by Dr. Denyse Amass on 03/31/21 for this issue, thought to be due to chondromalacia patella and patellofemoral pain syndrome. Pt was previously referred to PT to focus on VMO strengthening and completed 1 visits on 04/21/21. Today, pt reports today her knee is doing well. Patient states that she stands a lot at her job and the pain is really bad and it feels like a needle in her lateral right knee while she is walking and standing. Patient states both knees are getting louder with the "crunchiness." ? ?R knee swelling: ?Mechanical symptoms: ?Aggravates: ?Treatments tried: prior PT/HEP, IBU ? ?Dx imaging: 03/10/21 R knee XR ? ?Pertinent review of systems: No fevers or chills ? ?Relevant historical information: History of parotitis ? ? ?Exam:  ?BP 130/84   Pulse 75   Ht 5\' 2"  (1.575 m)   Wt 167 lb (75.8 kg)   SpO2 98%   BMI 30.54 kg/m?  ?General: Well Developed, well nourished, and in no acute distress.  ? ?MSK: Right knee: Normal-appearing ?Normal motion with crepitation. ?Intact strength some pain with resisted knee extension. ? ?Left knee: Normal-appearing ?Normal motion with crepitation. ?Pain with resisted knee extension. ? ? ? ?Lab and Radiology Results ? ?Procedure: Real-time Ultrasound Guided Injection of right knee superior lateral patellar space ?Device: Philips Affiniti 50G ?Images permanently stored and available for review in PACS ?Verbal informed consent obtained.  Discussed risks and benefits of procedure. Warned about infection bleeding damage to structures skin hypopigmentation and fat atrophy among others. ?Patient expresses understanding and agreement ?Time-out conducted.   ?Noted no overlying erythema, induration, or other signs of local infection.   ?Skin prepped in a sterile fashion.    ?Local anesthesia: Topical Ethyl chloride.   ?With sterile technique and under real time ultrasound guidance: 40 mg of Kenalog and 2 mL of Marcaine injected into knee joint. Fluid seen entering the joint capsule.   ?Completed without difficulty   ?Pain immediately resolved suggesting accurate placement of the medication.   ?Advised to call if fevers/chills, erythema, induration, drainage, or persistent bleeding.   ?Images permanently stored and available for review in the ultrasound unit.  ?Impression: Technically successful ultrasound guided injection. ? ? ? ? ?  ?EXAM: ?RIGHT KNEE 3 VIEWS ?  ?COMPARISON:  None. ?  ?FINDINGS: ?No evidence of fracture, dislocation, or joint effusion. Normal ?joint spaces and alignment. Patella normally situated in the ?trochlear groove. There is trace lateral patellar spurring. Minimal ?spurring of the tibial spines. No erosion or bony destruction. Soft ?tissues are unremarkable. ?  ?IMPRESSION: ?Trace lateral patellar and tibial spine spurring suggesting early ?degenerative change. ?  ?  ?Electronically Signed ?  By: M.D. ?  On: 03/11/2021 11:21 ?I, 05/11/2021, personally (independently) visualized and performed the interpretation of the images attached in this note. ? ? ? ?Assessment and Plan: ?36 y.o. female with bilateral anterior knee pain right worse than left.  Pain thought to be due to patellofemoral chondromalacia.  Plan for steroid injection right knee and referral back to PT to further work on quadricep strengthening.  She had 1 physical therapy session last year for this but her therapy and home exercise program was interrupted by an episode of parotitis.  I do not think she really did as  much as she could of because of her other health conditions. ? ?Plan recheck in 6 to 8 weeks. ? ? ?PDMP not reviewed this encounter. ?Orders Placed This Encounter  ?Procedures  ? [place] LIMITED JOINT SPACE STRUCTURES LOW RIGHT(NO LINKED CHARGES)  ?  Standing Status:    Future  ?  Number of Occurrences:   1  ?  Standing Expiration Date:   02/10/2023  ?  Order Specific Question:   Reason for Exam (SYMPTOM  OR DIAGNOSIS REQUIRED)  ?  Answer:   right knee pain  ?  Order Specific Question:   Preferred imaging location?  ?  Answer:   Adult nurse [facility]  ? Ambulatory referral to Physical Therapy  ?  Referral Priority:   Routine  ?  Referral Type:   Physical Medicine  ?  Referral Reason:   Specialty Services Required  ?  Requested Specialty:   Physical Therapy  ?  Number of Visits Requested:   1  ? ?No orders of the defined types were placed in this encounter. ? ? ? ?Discussed warning signs or symptoms. Please see discharge instructions. Patient expresses understanding. ? ? ?The above documentation has been reviewed and is accurate and complete [name], M.D. ? ? ?

## 2022-02-09 NOTE — Patient Instructions (Addendum)
Good to see you  ?Injection given in right knee day ?Call or go to the ER if you develop a large red swollen joint with extreme pain or oozing puss.  ?Check with PCP about your parotic gland  ?Re-referred to [place]  ?Follow up in 8 weeks ? ? ? ?

## 2022-02-10 NOTE — Telephone Encounter (Signed)
Lvm for the pt to call the office back. 

## 2022-02-11 NOTE — Telephone Encounter (Signed)
Pt was seen by [facility] on 02/09/2022. Pt says that the message below was written in error. She complains of pain with salivatory gland. She was transferred to the front desk to make an appointment.   ?

## 2022-02-23 ENCOUNTER — Ambulatory Visit (INDEPENDENT_AMBULATORY_CARE_PROVIDER_SITE_OTHER): Payer: Managed Care, Other (non HMO) | Admitting: Family Medicine

## 2022-02-23 ENCOUNTER — Encounter: Payer: Self-pay | Admitting: Family Medicine

## 2022-02-23 VITALS — BP 105/72 | HR 86 | Temp 98.3°F | Ht 62.0 in | Wt 167.2 lb

## 2022-02-23 DIAGNOSIS — F458 Other somatoform disorders: Secondary | ICD-10-CM

## 2022-02-23 DIAGNOSIS — M26623 Arthralgia of bilateral temporomandibular joint: Secondary | ICD-10-CM

## 2022-02-23 MED ORDER — CYCLOBENZAPRINE HCL 10 MG PO TABS: 10.0000 mg | ORAL_TABLET | Freq: Every evening | ORAL | 0 refills | Status: DC | PRN

## 2022-02-23 NOTE — Progress Notes (Signed)
? ?Subjective  ?CC:  ?Chief Complaint  ?Patient presents with  ? Sialadenitis  ?  Worsened in the last 2 weeks. She has tried OTC lemon drops for soothing.  ? ? ?HPI: Carol May is a 36 y.o. female who presents to the office today to address the problems listed above in the chief complaint. ?36 year old presents due to right jaw pain.  She had an episode of parotitis last May.  She reports that over the last 2 weeks she has had pain especially with chewing hard foods like hard potato chips or candies.  Pain is mostly on the right, described as an aching pain extending of the right jaw bone but also will have pain on the left side.  She has not noticed any swelling of the side of the face or facial tenderness.  She does not get pain with eating soft foods.  No neck pain.  She is a significant teeth grinder.  She uses a nightguard that she got 2 years ago and she can see wear-and-tear on that already.  She denies new stressors.  She has full range of motion of her jaw.  No sore throat.  No fevers or chills. ? ?Assessment  ?1. Bilateral temporomandibular joint pain   ?2. Bruxism   ? ?  ?Plan  ?TMJ, right greater than left: Discussed etiology of pain.  This is the likely source.  No parotid abnormalities on exam.  History is atypical for parotitis.  Reassured.  Recommend Advil twice daily for the next week or 2, trial of muscle relaxer at night.  Follow-up if not improving.  Educational handout given ?Bruxism: Continue nightguard.  Follow-up with dentist if needed.  Can consider oral maxillofacial specialist as well. ? ?Follow up: CPE as scheduled ?03/30/2022 ? ?No orders of the defined types were placed in this encounter. ? ?Meds ordered this encounter  ?Medications  ? cyclobenzaprine (FLEXERIL) 10 MG tablet  ?  Sig: Take 1 tablet (10 mg total) by mouth at bedtime as needed for muscle spasms.  ?  Dispense:  30 tablet  ?  Refill:  0  ? ?  ? ?I reviewed the patients updated PMH, FH, and SocHx.  ?  ?Patient Active  Problem List  ? Diagnosis Date Noted  ? Depression, major, single episode, moderate ([place]) 09/15/2021  ? Patellofemoral chondrosis of right knee 03/31/2021  ? Right anterior knee pain 03/31/2021  ? Adjustment disorder with mixed anxiety and depressed mood 03/10/2021  ? Cervical dysplasia 01/21/2019  ? Presence of subcutaneous contraceptive implant 09/12/2018  ? AR (allergic rhinitis) 04/01/2014  ? ?Current Meds  ?Medication Sig  ? Boric Acid Vaginal 600 MG SUPP insert ONE capsule vaginally AT bedtime FOR SEVEN DAYS, THEN USE TWO TO THREE times a WEEK.  ? buPROPion (WELLBUTRIN XL) 300 MG 24 hr tablet Take 1 tablet (300 mg total) by mouth daily.  ? cyclobenzaprine (FLEXERIL) 10 MG tablet Take 1 tablet (10 mg total) by mouth at bedtime as needed for muscle spasms.  ? escitalopram (LEXAPRO) 10 MG tablet TAKE 1 TABLET BY MOUTH EVERY DAY  ? UNABLE TO FIND Med Name: Herbal Swiss  ? ? ?Allergies: ?Patient has No Known Allergies. ?Family History: ?Patient family history includes Asthma in her brother; Healthy in her daughter, mother, son, and son; Hyperlipidemia in her father. ?Social History:  ?Patient  reports that she has never smoked. She has never used smokeless tobacco. She reports that she does not drink alcohol and does not use drugs. ? ?  Review of Systems: ?Constitutional: Negative for fever malaise or anorexia ?Cardiovascular: negative for chest pain ?Respiratory: negative for SOB or persistent cough ?Gastrointestinal: negative for abdominal pain ? ?Objective  ?Vitals: BP 105/72   Pulse 86   Temp 98.3 ?F (36.8 ?C) (Temporal)   Ht 5\' 2"  (1.575 m)   Wt 167 lb 3.2 oz (75.8 kg)   SpO2 99%   BMI 30.58 kg/m?  ?General: no acute distress , A&Ox3 ?HEENT: PEERL, conjunctiva normal, neck is supple, clear oropharynx, bilateral temporomandibular joints sublux without tenderness, no preauricle or neck masses, no cervical lymphadenopathy.  Parotid gland is nonpalpable and nontender. ? ? ?Commons side effects, risks,  benefits, and alternatives for medications and treatment plan prescribed today were discussed, and the patient expressed understanding of the given instructions. Patient is instructed to call or message via MyChart if he/she has any questions or concerns regarding our treatment plan. No barriers to understanding were identified. We discussed Red Flag symptoms and signs in detail. Patient expressed understanding regarding what to do in case of urgent or emergency type symptoms.  ?Medication list was reconciled, printed and provided to the patient in AVS. Patient instructions and summary information was reviewed with the patient as documented in the AVS. ?This note was prepared with assistance of Systems analyst. Occasional wrong-word or sound-a-like substitutions may have occurred due to the inherent limitations of voice recognition software ? ?This visit occurred during the SARS-CoV-2 public health emergency.  Safety protocols were in place, including screening questions prior to the visit, additional usage of staff PPE, and extensive cleaning of exam room while observing appropriate contact time as indicated for disinfecting solutions.  ? ?

## 2022-02-23 NOTE — Patient Instructions (Signed)
Please follow up as scheduled for your next visit with me: 03/30/2022  ? ?If you have any questions or concerns, please don't hesitate to send me a message via MyChart or call the office at [phone]. Thank you for visiting with [place] today! It's our pleasure caring for you.  ? ?Temporomandibular Joint Syndrome ?Temporomandibular joint syndrome (TMJ syndrome) is a condition that causes pain in the temporomandibular joints. These joints are located near your ears and allow your jaw to open and close. For people with TMJ syndrome, chewing, biting, or other movements of the jaw can be difficult or painful. ?TMJ syndrome is often mild and goes away within a few weeks. However, sometimes the condition becomes a long-term (chronic) problem. ?What are the causes? ?This condition may be caused by: ?Grinding your teeth or clenching your jaw. Some people do this when they are stressed. ?Arthritis. ?An injury to the jaw. ?A head or neck injury. ?Teeth or dentures that are not aligned well. ?In some cases, the cause of TMJ syndrome may not be known. ?What are the signs or symptoms? ?The most common symptom of this condition is aching pain on the side of the head in the area of the TMJ. Other symptoms may include: ?Pain when moving your jaw, such as when chewing or biting. ?Not being able to open your jaw all the way. ?Making a clicking sound when you open your mouth. ?Headache. ?Earache. ?Neck or shoulder pain. ?How is this diagnosed? ?This condition may be diagnosed based on: ?Your symptoms and medical history. ?A physical exam. Your health care provider may check the range of motion of your jaw. ?Imaging tests, such as X-rays or an MRI. ?You may also need to see your dentist, who will check if your teeth and jaw are lined up correctly. ?How is this treated? ?TMJ syndrome often goes away on its own. If treatment is needed, it may include: ?Eating soft foods and applying ice or heat. ?Medicines to relieve pain or  inflammation. ?Medicines or massage to relax the muscles. ?A splint, bite plate, or mouthpiece to prevent teeth grinding or jaw clenching. ?Relaxation techniques or counseling to help reduce stress. ?A therapy for pain in which an electrical current is applied to the nerves through the skin (transcutaneous electrical nerve stimulation). ?Acupuncture. This may help to relieve pain. ?Jaw surgery. This is rarely needed. ?Follow these instructions at home: ?Eating and drinking ?Eat a soft diet if you are having trouble chewing. ?Avoid foods that require a lot of chewing. Do not chew gum. ?General instructions ?Take over-the-counter and prescription medicines only as told by your health care provider. ?If directed, put ice on the painful area. To do this: ?Put ice in a plastic bag. ?Place a towel between your skin and the bag. ?Leave the ice on for 20 minutes, 2-3 times a day. ?Remove the ice if your skin turns bright red. This is very important. If you cannot feel pain, heat, or cold, you have a greater risk of damage to the area. ?Apply a warm, wet cloth (warm compress) to the painful area as told. ?Massage your jaw area and do any jaw stretching exercises as told by your health care provider. ?If you were given a splint, bite plate, or mouthpiece, wear it as told by your health care provider. ?Keep all follow-up visits. This is important. ?Where to find more information ?[employer]: [employer] ?Contact a health care provider if: ?You have trouble eating. ?You have new or  worsening symptoms. ?Get help right away if: ?Your jaw locks. ?Summary ?Temporomandibular joint syndrome (TMJ syndrome) is a condition that causes pain in the temporomandibular joints. These joints are located near your ears and allow your jaw to open and close. ?TMJ syndrome is often mild and goes away within a few weeks. However, sometimes the condition becomes a long-term (chronic)  problem. ?Symptoms include an aching pain on the side of the head in the area of the TMJ, pain when chewing or biting, and being unable to open your jaw all the way. You may also make a clicking sound when you open your mouth. ?TMJ syndrome often goes away on its own. If treatment is needed, it may include medicines to relieve pain, reduce inflammation, or relax the muscles. A splint, bite plate, or mouthpiece may also be used to prevent teeth grinding or jaw clenching. ?This information is not intended to replace advice given to you by your health care provider. Make sure you discuss any questions you have with your health care provider. ?Document Revised: 07/04/2021 Document Reviewed: 07/04/2021 ?Elsevier Patient Education ? 2022 [employer] ? ?

## 2022-03-16 ENCOUNTER — Encounter: Payer: 59 | Admitting: Family Medicine

## 2022-03-30 ENCOUNTER — Encounter: Payer: Self-pay | Admitting: Family Medicine

## 2022-03-30 ENCOUNTER — Encounter: Payer: 59 | Admitting: Family Medicine

## 2022-03-30 ENCOUNTER — Ambulatory Visit (INDEPENDENT_AMBULATORY_CARE_PROVIDER_SITE_OTHER): Payer: Managed Care, Other (non HMO) | Admitting: Family Medicine

## 2022-03-30 VITALS — BP 100/60 | HR 87 | Temp 98.7°F | Ht 62.0 in | Wt 160.6 lb

## 2022-03-30 DIAGNOSIS — Z0000 Encounter for general adult medical examination without abnormal findings: Secondary | ICD-10-CM | POA: Diagnosis not present

## 2022-03-30 DIAGNOSIS — F321 Major depressive disorder, single episode, moderate: Secondary | ICD-10-CM | POA: Diagnosis not present

## 2022-03-30 DIAGNOSIS — F4323 Adjustment disorder with mixed anxiety and depressed mood: Secondary | ICD-10-CM | POA: Diagnosis not present

## 2022-03-30 DIAGNOSIS — Z975 Presence of (intrauterine) contraceptive device: Secondary | ICD-10-CM | POA: Diagnosis not present

## 2022-03-30 DIAGNOSIS — M26623 Arthralgia of bilateral temporomandibular joint: Secondary | ICD-10-CM | POA: Insufficient documentation

## 2022-03-30 MED ORDER — BUPROPION HCL ER (XL) 300 MG PO TB24: 300.0000 mg | ORAL_TABLET | Freq: Every day | ORAL | 3 refills | Status: DC

## 2022-03-30 NOTE — Progress Notes (Signed)
? ?Subjective  ?No chief complaint on file. ? ? ?HPI: Carol May is a 36 y.o. female who presents to [facility] at [place] today for a Female Wellness Visit.  ?She also has the concerns and/or needs as listed above in the chief complaint. These will be addressed in addition to the Health Maintenance Visit.  ? ?Wellness Visit: annual visit with health maintenance review and exam without Pap ? ?Health maintenance is up-to-date.  Nexplanon for birth control.  Overall doing very well. ?Chronic disease management visit and/or acute problem visit: ?Adjustment disorder and history of depression: Now very stable on Wellbutrin XL 300 daily.  No longer taking Lexapro.  Had some nausea and dizziness while taking both medications.  She stopped this about 4 weeks ago. ? ?Assessment  ?1. Annual physical exam   ?2. Adjustment disorder with mixed anxiety and depressed mood   ?3. Depression, major, single episode, moderate ([place])   ?4. Presence of subcutaneous contraceptive implant   ? ?  ?Plan  ?Female Wellness Visit: ?Age appropriate Health Maintenance and Prevention measures were discussed with patient. Included topics are cancer screening recommendations, ways to keep healthy (see AVS) including dietary and exercise recommendations, regular eye and dental care, use of seat belts, and avoidance of moderate alcohol use and tobacco use.  ?BMI: discussed patient's BMI and encouraged positive lifestyle modifications to help get to or maintain a target BMI. ?HM needs and immunizations were addressed and ordered. See below for orders. See HM and immunization section for updates. ?Routine labs and screening tests ordered including cmp, cbc and lipids where appropriate. ?Discussed recommendations regarding Vit D and calcium supplementation (see AVS) ? ?Chronic disease f/u and/or acute problem visit: (deemed necessary to be done in addition to the wellness visit): ?Adjustment disorder and depression: Continue  Wellbutrin XL 300 daily.  Well-controlled ? ?Follow up: Return in about 1 year (around 03/31/2023) for complete physical.  ? ?Orders Placed This Encounter  ?Procedures  ? CBC with Differential/Platelet  ? Comprehensive metabolic panel  ? Lipid panel  ? TSH  ? ?Meds ordered this encounter  ?Medications  ? buPROPion (WELLBUTRIN XL) 300 MG 24 hr tablet  ?  Sig: Take 1 tablet (300 mg total) by mouth daily.  ?  Dispense:  90 tablet  ?  Refill:  3  ? ?  ? ? ?Body mass index is 29.37 kg/m?. ?Wt Readings from Last 3 Encounters:  ?03/30/22 160 lb 9.6 oz (72.8 kg)  ?02/23/22 167 lb 3.2 oz (75.8 kg)  ?02/09/22 167 lb (75.8 kg)  ? ? ? ?Patient Active Problem List  ? Diagnosis Date Noted  ? Bilateral temporomandibular joint pain 03/30/2022  ? Depression, major, single episode, moderate ([place]) 09/15/2021  ? Patellofemoral chondrosis of right knee 03/31/2021  ? Right anterior knee pain 03/31/2021  ? Adjustment disorder with mixed anxiety and depressed mood 03/10/2021  ? Cervical dysplasia 01/21/2019  ?  CIN 1-2; s/p LEEP 01/2019 Dr. Sandford Craze ? ?  ? Presence of subcutaneous contraceptive implant 09/12/2018  ?  March 2022; nexplanon #3, Dr. Melba Coon ? ?  ? AR (allergic rhinitis) 04/01/2014  ? ?Health Maintenance  ?Topic Date Due  ? INFLUENZA VACCINE  07/05/2022  ? PAP SMEAR-Modifier  02/03/2024  ? TETANUS/TDAP  11/14/2024  ? Hepatitis C Screening  Completed  ? HIV Screening  Completed  ? HPV VACCINES  Aged Out  ? COVID-19 Vaccine  Discontinued  ? ?Immunization History  ?Administered Date(s) Administered  ? HPV 9-valent 04/09/2015,  06/09/2015  ? Hepatitis B, ped/adol 08/29/2000, 10/02/2000, 02/21/2001  ? Influenza, Seasonal, Injecte, Preservative Fre 09/22/2016  ? Influenza,inj,Quad PF,6+ Mos 09/15/2021  ? Influenza-Unspecified 09/15/2016, 09/12/2018, 11/05/2020  ? MMR 07/28/1994, 04/11/2003  ? PPD Test 02/27/2017  ? Tdap 04/22/2012, 09/13/2014, 11/14/2014  ? Varicella 04/11/2003  ? ?We updated and reviewed the patient's past  history in detail and it is documented below. ?Allergies: ?Patient  reports no history of alcohol use. ?Past Medical History ?Patient  has a past medical history of AR (allergic rhinitis), Cervical dysplasia (01/21/2019), and H/O varicella. ?Past Surgical History ?Patient  has a past surgical history that includes Open reduction internal fixation (orif) tibia/fibula fracture (Right, 08/11/2016) and Colposcopy (12/19/2018). ?Social History  ? ?Socioeconomic History  ? Marital status: Single  ?  Spouse name: lives with boyfriend  ? Number of children: 3  ? Years of education: Not on file  ? Highest education level: Not on file  ?Occupational History  ? Occupation: dental hygeinist  ?  Employer: [employer]  ?Tobacco Use  ? Smoking status: Never  ? Smokeless tobacco: Never  ?Vaping Use  ? Vaping Use: Never used  ?Substance and Sexual Activity  ? Alcohol use: No  ? Drug use: No  ? Sexual activity: Yes  ?  Birth control/protection: Implant  ?Other Topics Concern  ? Not on file  ?Social History Narrative  ? Not on file  ? ?Social Determinants of Health  ? ?Financial Resource Strain: Not on file  ?Food Insecurity: Not on file  ?Transportation Needs: Not on file  ?Physical Activity: Not on file  ?Stress: Not on file  ?Social Connections: Not on file  ? ?Family History  ?Problem Relation Age of Onset  ? Hyperlipidemia Father   ? Asthma Brother   ? Healthy Daughter   ? Healthy Son   ? Healthy Son   ? Healthy Mother   ? ? ?Review of Systems: ?Constitutional: negative for fever or malaise ?Ophthalmic: negative for photophobia, double vision or loss of vision ?Cardiovascular: negative for chest pain, dyspnea on exertion, or new LE swelling ?Respiratory: negative for SOB or persistent cough ?Gastrointestinal: negative for abdominal pain, change in bowel habits or melena ?Genitourinary: negative for dysuria or gross hematuria, no abnormal uterine bleeding or disharge ?Musculoskeletal: negative for new gait disturbance or muscular  weakness ?Integumentary: negative for new or persistent rashes, no breast lumps ?Neurological: negative for TIA or stroke symptoms ?Psychiatric: negative for SI or delusions ?Allergic/Immunologic: negative for hives ? ?Patient Care Team  ?  Relationship Specialty Notifications Start End  ?Leamon Arnt, MD PCP - General Family Medicine  08/20/18   ?Janyth Contes, MD Consulting Physician Obstetrics and Gynecology  03/10/21   ?Gregor Hams, MD Consulting Physician Family Medicine  02/02/22   ? ? ?Objective  ?Vitals: BP 100/60   Pulse 87   Temp 98.7 ?F (37.1 ?C)   Ht _0  (1.575 m)   Wt 160 lb 9.6 oz (72.8 kg)   SpO2 96%   BMI 29.37 kg/m?  ?General:  Well developed, well nourished, no acute distress  ?Psych:  Alert and orientedx3,normal mood and affect ?HEENT:  Normocephalic, atraumatic, non-icteric sclera, PERRL, supple neck without adenopathy, mass or thyromegaly ?Cardiovascular:  Normal S1, S2, RRR without gallop, rub or murmur ?Respiratory:  Good breath sounds bilaterally, CTAB with normal respiratory effort ?Gastrointestinal: normal bowel sounds, soft, non-tender, no noted masses. No HSM ?MSK: no deformities, contusions. Joints are without erythema or swelling.  ?Skin:  Warm, no  rashes or suspicious lesions noted ?Neurologic:    Mental status is normal. Gross motor and sensory exams are normal. Normal gait. No tremor ? ? ? ?Commons side effects, risks, benefits, and alternatives for medications and treatment plan prescribed today were discussed, and the patient expressed understanding of the given instructions. Patient is instructed to call or message via MyChart if he/she has any questions or concerns regarding our treatment plan. No barriers to understanding were identified. We discussed Red Flag symptoms and signs in detail. Patient expressed understanding regarding what to do in case of urgent or emergency type symptoms.  ?Medication list was reconciled, printed and provided to the patient in AVS.  Patient instructions and summary information was reviewed with the patient as documented in the AVS. ?This note was prepared with assistance of Systems analyst. Occasional wrong-word or sound-a-like sub

## 2022-03-30 NOTE — Patient Instructions (Signed)

## 2022-03-31 LAB — COMPREHENSIVE METABOLIC PANEL
ALT: 21 U/L (ref 0–35)
AST: 26 U/L (ref 0–37)
Albumin: 4.2 g/dL (ref 3.5–5.2)
Alkaline Phosphatase: 50 U/L (ref 39–117)
BUN: 12 mg/dL (ref 6–23)
CO2: 25 mEq/L (ref 19–32)
Calcium: 9.2 mg/dL (ref 8.4–10.5)
Chloride: 107 mEq/L (ref 96–112)
Creatinine, Ser: 0.68 mg/dL (ref 0.40–1.20)
GFR: 114.23 mL/min (ref >60.00)
Glucose, Bld: 76 mg/dL (ref 70–99)
Potassium: 3.6 mEq/L (ref 3.5–5.1)
Sodium: 140 mEq/L (ref 135–145)
Total Bilirubin: 0.3 mg/dL (ref 0.2–1.2)
Total Protein: 6.8 g/dL (ref 6.0–8.3)

## 2022-03-31 LAB — LIPID PANEL
Cholesterol: 209 mg/dL — ABNORMAL HIGH (ref 0–200)
HDL: 53.5 mg/dL (ref >39.00)
NonHDL: 155.45
Total CHOL/HDL Ratio: 4
Triglycerides: 233 mg/dL — ABNORMAL HIGH (ref 0.0–149.0)
VLDL: 46.6 mg/dL — ABNORMAL HIGH (ref 0.0–40.0)

## 2022-03-31 LAB — CBC WITH DIFFERENTIAL/PLATELET
Basophils Absolute: 0 10*3/uL (ref 0.0–0.1)
Basophils Relative: 0.5 % (ref 0.0–3.0)
Eosinophils Absolute: 0.2 10*3/uL (ref 0.0–0.7)
Eosinophils Relative: 3.6 % (ref 0.0–5.0)
HCT: 39.2 % (ref 36.0–46.0)
Hemoglobin: 13.3 g/dL (ref 12.0–15.0)
Lymphocytes Relative: 26.1 % (ref 12.0–46.0)
Lymphs Abs: 1.3 10*3/uL (ref 0.7–4.0)
MCHC: 34.1 g/dL (ref 30.0–36.0)
MCV: 88 fl (ref 78.0–100.0)
Monocytes Absolute: 0.4 10*3/uL (ref 0.1–1.0)
Monocytes Relative: 7.7 % (ref 3.0–12.0)
Neutro Abs: 3 10*3/uL (ref 1.4–7.7)
Neutrophils Relative %: 62.1 % (ref 43.0–77.0)
Platelets: 213 10*3/uL (ref 150.0–400.0)
RBC: 4.45 Mil/uL (ref 3.87–5.11)
RDW: 12.4 % (ref 11.5–15.5)
WBC: 4.9 10*3/uL (ref 4.0–10.5)

## 2022-03-31 LAB — LDL CHOLESTEROL, DIRECT: Direct LDL: 134 mg/dL

## 2022-03-31 LAB — TSH: TSH: 1.47 u[IU]/mL (ref 0.35–5.50)

## 2022-04-13 ENCOUNTER — Ambulatory Visit: Payer: Commercial Managed Care - HMO | Admitting: Family Medicine

## 2022-04-13 NOTE — Progress Notes (Deleted)
   I, Philbert Riser, LAT, ATC acting as a scribe for [name], MD.  Carol May is a 36 y.o. female who presents to [facility] at [facility] today for f/u bilat knee pain, R>L. Pt was last seen by Dr. Denyse Amass on 02/09/22 and was given a R knee steroid injection and referred back to PT, but has not completed/schedule any visits. Today, pt reports  Dx imaging: 03/10/21 R knee XR  Pertinent review of systems: ***  Relevant historical information: ***   Exam:  There were no vitals taken for this visit. General: Well Developed, well nourished, and in no acute distress.   MSK: ***    Lab and Radiology Results No results found for this or any previous visit (from the past 72 hour(s)). No results found.     Assessment and Plan: 36 y.o. female with ***   PDMP not reviewed this encounter. No orders of the defined types were placed in this encounter.  No orders of the defined types were placed in this encounter.    Discussed warning signs or symptoms. Please see discharge instructions. Patient expresses understanding.   ***

## 2022-04-14 ENCOUNTER — Encounter: Payer: Self-pay | Admitting: *Deleted

## 2022-04-22 NOTE — Progress Notes (Deleted)
   I, Christoper Fabian, LAT, ATC, am serving as scribe for Dr. [name].  Carol May is a 36 y.o. female who presents to [facility] at [facility] today for B knee pain.  She was last seen by Dr. Denyse Amass on 02/09/22 for R knee pain f/u and had a R knee steroid injection.  She also reported B knee pain and "crunching" and was re-referred to PT of which she did not attend any visits.  Today, pt reports   Diagnostic testing: R knee XR- 03/10/21  Pertinent review of systems: ***  Relevant historical information: ***   Exam:  There were no vitals taken for this visit. General: Well Developed, well nourished, and in no acute distress.   MSK: ***    Lab and Radiology Results No results found for this or any previous visit (from the past 72 hour(s)). No results found.     Assessment and Plan: 36 y.o. female with ***   PDMP not reviewed this encounter. No orders of the defined types were placed in this encounter.  No orders of the defined types were placed in this encounter.    Discussed warning signs or symptoms. Please see discharge instructions. Patient expresses understanding.   ***

## 2022-04-25 ENCOUNTER — Ambulatory Visit: Payer: Commercial Managed Care - HMO | Admitting: Family Medicine

## 2022-04-25 ENCOUNTER — Encounter: Payer: Self-pay | Admitting: Family Medicine

## 2022-04-25 ENCOUNTER — Ambulatory Visit (INDEPENDENT_AMBULATORY_CARE_PROVIDER_SITE_OTHER): Payer: Commercial Managed Care - HMO | Admitting: Family Medicine

## 2022-04-25 VITALS — BP 106/80 | HR 70 | Ht 62.0 in | Wt 164.0 lb

## 2022-04-25 DIAGNOSIS — M2241 Chondromalacia patellae, right knee: Secondary | ICD-10-CM

## 2022-04-25 DIAGNOSIS — M25562 Pain in left knee: Secondary | ICD-10-CM | POA: Diagnosis not present

## 2022-04-25 DIAGNOSIS — M25561 Pain in right knee: Secondary | ICD-10-CM

## 2022-04-25 DIAGNOSIS — G8929 Other chronic pain: Secondary | ICD-10-CM | POA: Diagnosis not present

## 2022-04-25 NOTE — Patient Instructions (Signed)
Good to see you today.  I've referred you for a R knee MRI.  [facility] will call you to schedule but please let us know if you don't hear from them in one week regarding scheduling.  Follow-up: after MRI

## 2022-04-25 NOTE — Progress Notes (Signed)
I, Carol May, LAT, ATC, am serving as scribe for Dr. [name].   Carol May is a 36 y.o. female who presents to [facility] at [facility] today for f/u of B knee pain, R>L, thought to be due to patellofemoral chondromalacia.  She was last seen by Dr. Georgina Snell on 02/09/22 and had a R knee steroid injection and was re-referred to PT but has not attended any visits.  Today, pt reports that she con't to have B knee pain, R>L.  She notes that she is feeling ok today but noted increased R medial knee pain along the medial patellar border last week.  She states that if she tries to squat, then she has increased pain.  She has been doing a HEP per her one PT visit from 2022.  She notes that her prior R knee steroid injection from her last visit did help for a few weeks.  Diagnostic testing: R knee XR- 03/10/21  Pertinent review of systems: No fevers or chills  Relevant historical information: History of depression   Exam:  BP 106/80 (BP Location: Right Arm, Patient Position: Sitting, Cuff Size: Normal)   Pulse 70   Ht 5\' 2"  (1.575 m)   Wt 164 lb (74.4 kg)   SpO2 97%   BMI 30.00 kg/m  General: Well Developed, well nourished, and in no acute distress.   MSK: Right knee normal-appearing Tender palpation medial joint line. Normal motion crepitation with extension. Stable ligamentous exam. Positive medial McMurray's test.    Lab and Radiology Results  EXAM: RIGHT KNEE 3 VIEWS   COMPARISON:  None.   FINDINGS: No evidence of fracture, dislocation, or joint effusion. Normal joint spaces and alignment. Patella normally situated in the trochlear groove. There is trace lateral patellar spurring. Minimal spurring of the tibial spines. No erosion or bony destruction. Soft tissues are unremarkable.   IMPRESSION: Trace lateral patellar and tibial spine spurring suggesting early degenerative change.     Electronically Signed   By: Keith Rake M.D.   On:  03/11/2021 11:21 I, [name], personally (independently) visualized and performed the interpretation of the images attached in this note.      Assessment and Plan: 36 y.o. female with right knee pain.  Unfortunately pain did not improve with steroid injection and physician directed home exercise program over the last 2 months.  She continues to have medial and anterior knee pain.  This is suspicious for a medial meniscus injury/tear and patellofemoral chondromalacia.  Plan for MRI to further characterize source of pain and for potential surgical planning or further treatment options including potentially dedicated physical therapy. Recheck after MRI.   PDMP not reviewed this encounter. Orders Placed This Encounter  Procedures   MR Knee Right Wo Contrast    Standing Status:   Future    Standing Expiration Date:   04/26/2023    Order Specific Question:   What is the patient's sedation requirement?    Answer:   No Sedation    Order Specific Question:   Does the patient have a pacemaker or implanted devices?    Answer:   No    Order Specific Question:   Preferred imaging location?    Answer:   Product/process development scientist (table limit-350lbs)   No orders of the defined types were placed in this encounter.    Discussed warning signs or symptoms. Please see discharge instructions. Patient expresses understanding.   The above documentation has been reviewed and is accurate and complete  [name], M.D.

## 2022-05-08 ENCOUNTER — Ambulatory Visit (INDEPENDENT_AMBULATORY_CARE_PROVIDER_SITE_OTHER): Payer: Commercial Managed Care - HMO

## 2022-05-08 DIAGNOSIS — M25561 Pain in right knee: Secondary | ICD-10-CM | POA: Diagnosis not present

## 2022-05-08 DIAGNOSIS — G8929 Other chronic pain: Secondary | ICD-10-CM | POA: Diagnosis not present

## 2022-05-08 DIAGNOSIS — M2241 Chondromalacia patellae, right knee: Secondary | ICD-10-CM

## 2022-05-10 NOTE — Progress Notes (Signed)
MRI shows areas of significant high-grade cartilage loss especially underneath the kneecap.  Underneath this area of the bone is bruised and irritated.  This is likely the source of her pain.  He did not have a meniscus tear or ligament tears.  Recommend return to clinic to go over the results in full detail and talk about treatment plan and options from here.

## 2022-06-01 ENCOUNTER — Ambulatory Visit: Payer: Commercial Managed Care - HMO | Admitting: Family Medicine

## 2022-06-01 NOTE — Progress Notes (Deleted)
   I, Philbert Riser, LAT, ATC acting as a scribe for [name], MD.  Carol May is a 36 y.o. female who presents to [facility] at [facility] today for f/u R knee pain MRI review. Pt's last R knee steroid injection was on 02/09/22 and provided only minimal relief. Pt was last seen by Dr. Denyse Amass on 04/25/22 and was advised to proceed to MRI. Today, pt reports  Dx imaging: 05/08/22 R knee MRI  03/10/21 R knee XR  Pertinent review of systems: ***  Relevant historical information: ***   Exam:  There were no vitals taken for this visit. General: Well Developed, well nourished, and in no acute distress.   MSK: ***    Lab and Radiology Results No results found for this or any previous visit (from the past 72 hour(s)). No results found.     Assessment and Plan: 36 y.o. female with ***   PDMP not reviewed this encounter. No orders of the defined types were placed in this encounter.  No orders of the defined types were placed in this encounter.    Discussed warning signs or symptoms. Please see discharge instructions. Patient expresses understanding.   ***

## 2022-06-15 ENCOUNTER — Ambulatory Visit: Payer: Commercial Managed Care - HMO | Admitting: Family Medicine

## 2022-06-22 ENCOUNTER — Ambulatory Visit (INDEPENDENT_AMBULATORY_CARE_PROVIDER_SITE_OTHER): Payer: Commercial Managed Care - HMO | Admitting: Family Medicine

## 2022-06-22 VITALS — BP 116/78 | HR 85 | Ht 62.0 in | Wt 161.6 lb

## 2022-06-22 DIAGNOSIS — M2241 Chondromalacia patellae, right knee: Secondary | ICD-10-CM

## 2022-06-22 DIAGNOSIS — G8929 Other chronic pain: Secondary | ICD-10-CM | POA: Diagnosis not present

## 2022-06-22 DIAGNOSIS — M25561 Pain in right knee: Secondary | ICD-10-CM

## 2022-06-22 NOTE — Patient Instructions (Addendum)
Thank you for coming in today.   I've referred you to Physical Therapy.  Let us know if you don't hear from them in one week.   I've referred you to Orthopedic Surgery for a consultation.  Let us know if you don't hear from them in one week.   Let me know how it goes.

## 2022-06-22 NOTE — Progress Notes (Signed)
I, Philbert Riser, LAT, ATC acting as a scribe for [name], MD.  Carol May is a 36 y.o. female who presents to [facility] at [facility] today for f/u R knee pain MRI review. Pt's last R knee steroid injection was on 02/09/22 and provided only minimal relief. Pt was last seen by Dr. Denyse Amass on 04/25/22 and was advised to proceed to MRI. Today, pt reports R knee has been feeling OK lately. Pt notes intermittent pain along the lateral aspect of the R knee.  Dx imaging: 05/08/22 R knee MRI  03/10/21 R knee XR   Pertinent review of systems: No fevers or chills  Relevant historical information: History depression   Exam:  BP 116/78   Pulse 85   Ht 5\' 2"  (1.575 m)   Wt 161 lb 9.6 oz (73.3 kg)   SpO2 97%   BMI 29.56 kg/m  General: Well Developed, well nourished, and in no acute distress.   MSK: Right knee normal-appearing normal motion with crepitation.  Lateral patellar tracking.    Lab and Radiology Results  EXAM: MRI OF THE RIGHT KNEE WITHOUT CONTRAST   TECHNIQUE: Multiplanar, multisequence MR imaging of the knee was performed. No intravenous contrast was administered.   COMPARISON:  None Available.   FINDINGS: MENISCI   Medial: Intact.   Lateral: Intact. Subcortical reactive marrow changes of the root of the anterior horn of the lateral meniscus.   LIGAMENTS   Cruciates: ACL and PCL are intact.   Collaterals: Medial collateral ligament is intact. Lateral collateral ligament complex is intact.   CARTILAGE   Patellofemoral: High-grade partial-thickness cartilage loss with areas of full-thickness cartilage loss of the lateral patellar facet and lateral trochlea with subchondral reactive marrow edema in the lateral patellar facet.   Medial: Mild partial-thickness cartilage loss of the medial femorotibial compartment.   Lateral: Mild partial-thickness cartilage loss of the lateral femorotibial compartment.   JOINT: No joint effusion.  Edema in superolateral Hoffa's fat. No plical thickening.   POPLITEAL FOSSA: Popliteus tendon is intact. No Baker's cyst.   EXTENSOR MECHANISM: Intact quadriceps tendon. Intact patellar tendon. Intact lateral patellar retinaculum. Intact medial patellar retinaculum. Intact MPFL. TT-TG distance 9.3 mm.   BONES: No aggressive osseous lesion. No fracture or dislocation.   Other: No fluid collection or hematoma. Muscles are normal.   IMPRESSION: 1. Tricompartmental cartilage abnormalities as described above most severe in the lateral patellofemoral compartment. 2. Edema in superolateral Hoffa's fat as can be seen with patellar tendon-lateral femoral condyle friction syndrome.     Electronically Signed   By: M.D.   On: 05/09/2022 07:17   I, 07/09/2022, personally (independently) visualized and performed the interpretation of the images attached in this note.      Assessment and Plan: 36 y.o. female with right knee pain thought to be due to significant patellofemoral chondromalacia especially at the lateral patellar facet.  She had a little bit of physical therapy earlier this year but not enough in my opinion.  I think a retrial of physical therapy with special focus on quad strengthening would be helpful.  Additionally I think it is worth her time to have a consultation with orthopedic surgery to discuss the various surgical options for this condition as she is 36 years old and has what looks to be stage IV chondromalacia.  She may benefit from surgery or at least a surgical consultation.  Lastly we will work on authorization now for hyaluronic acid injections.  That may be an option at any point along this journey.  Check back as needed   PDMP not reviewed this encounter. Orders Placed This Encounter  Procedures   Ambulatory referral to Physical Therapy    Referral Priority:   Routine    Referral Type:   Physical Medicine    Referral Reason:   Specialty  Services Required    Requested Specialty:   Physical Therapy    Number of Visits Requested:   1   Ambulatory referral to Orthopedic Surgery    Referral Priority:   Routine    Referral Type:   Surgical    Referral Reason:   Specialty Services Required    Referred to Provider:   Huel Cote, MD    Requested Specialty:   Orthopedic Surgery    Number of Visits Requested:   1   No orders of the defined types were placed in this encounter.    Discussed warning signs or symptoms. Please see discharge instructions. Patient expresses understanding.   The above documentation has been reviewed and is accurate and complete [name], M.D.  Total encounter time 20 minutes including face-to-face time with the patient and, reviewing past medical record, and charting on the date of service.   Reviewed MRI findings and images.  Treatment plan options

## 2022-07-06 ENCOUNTER — Ambulatory Visit (HOSPITAL_BASED_OUTPATIENT_CLINIC_OR_DEPARTMENT_OTHER): Payer: Commercial Managed Care - HMO | Admitting: Orthopaedic Surgery

## 2022-07-20 ENCOUNTER — Ambulatory Visit: Payer: Self-pay

## 2022-07-20 ENCOUNTER — Ambulatory Visit (INDEPENDENT_AMBULATORY_CARE_PROVIDER_SITE_OTHER): Payer: Commercial Managed Care - HMO | Admitting: Family Medicine

## 2022-07-20 DIAGNOSIS — M25561 Pain in right knee: Secondary | ICD-10-CM | POA: Diagnosis not present

## 2022-07-20 DIAGNOSIS — G8929 Other chronic pain: Secondary | ICD-10-CM | POA: Diagnosis not present

## 2022-07-20 DIAGNOSIS — M2241 Chondromalacia patellae, right knee: Secondary | ICD-10-CM | POA: Diagnosis not present

## 2022-07-20 MED ORDER — SODIUM HYALURONATE (VISCOSUP) 16.8 MG/2ML IX SOSY
16.8000 mg | PREFILLED_SYRINGE | Freq: Once | INTRA_ARTICULAR | Status: AC
  Administered 2022-07-20: 16.8 mg via INTRA_ARTICULAR

## 2022-07-20 NOTE — Patient Instructions (Signed)
Thank you for coming in today.   Recheck next week and the week after for gelsyn injections number 2 and 3

## 2022-07-20 NOTE — Progress Notes (Unsigned)
   I, Philbert Riser, LAT, ATC acting as a scribe for [name], MD.  Carol May is a 36 y.o. female who presents to [facility] at [facility] today for cont'd chronic R knee pain. Pt was last seen by Dr. Denyse Amass on 06/22/22 and was re-referred to PT and referred to Orthopedic Surgery. Pt no-showed her surgical consultation on 8/2 and has not scheduled any PT visits. Today, pt reports  Dx imaging: 05/08/22 R knee MRI             03/10/21 R knee XR  Pertinent review of systems: ***  Relevant historical information: ***   Exam:  There were no vitals taken for this visit. General: Well Developed, well nourished, and in no acute distress.   MSK: ***    Lab and Radiology Results  Procedure: Real-time Ultrasound Guided Injection of ***  Device: Philips Affiniti 50G Images permanently stored and available for review in PACS Verbal informed consent obtained.  Discussed risks and benefits of procedure. Warned about infection, bleeding, hyperglycemia damage to structures among others. Patient expresses understanding and agreement Time-out conducted.   Noted no overlying erythema, induration, or other signs of local infection.   Skin prepped in a sterile fashion.   Local anesthesia: Topical Ethyl chloride.   With sterile technique and under real time ultrasound guidance:  *** injected into ***. Fluid seen entering the ***.   Completed without difficulty   Pain ***immediately resolved suggesting accurate placement of the medication.   Advised to call if fevers/chills, erythema, induration, drainage, or persistent bleeding.   Images permanently stored and available for review in the ultrasound unit.  Impression: Technically successful ultrasound guided injection. Lot number: [id number]       Assessment and Plan: 36 y.o. female with ***   PDMP not reviewed this encounter. No orders of the defined types were placed in this encounter.  No orders of the defined types  were placed in this encounter.    Discussed warning signs or symptoms. Please see discharge instructions. Patient expresses understanding.   ***

## 2022-07-27 ENCOUNTER — Ambulatory Visit (INDEPENDENT_AMBULATORY_CARE_PROVIDER_SITE_OTHER): Payer: Commercial Managed Care - HMO | Admitting: Family Medicine

## 2022-07-27 ENCOUNTER — Ambulatory Visit: Payer: Self-pay

## 2022-07-27 DIAGNOSIS — M25561 Pain in right knee: Secondary | ICD-10-CM

## 2022-07-27 DIAGNOSIS — G8929 Other chronic pain: Secondary | ICD-10-CM

## 2022-07-27 DIAGNOSIS — M2241 Chondromalacia patellae, right knee: Secondary | ICD-10-CM | POA: Diagnosis not present

## 2022-07-27 MED ORDER — SODIUM HYALURONATE (VISCOSUP) 16.8 MG/2ML IX SOSY
16.8000 mg | PREFILLED_SYRINGE | Freq: Once | INTRA_ARTICULAR | Status: AC
  Administered 2022-07-27: 16.8 mg via INTRA_ARTICULAR

## 2022-07-27 NOTE — Patient Instructions (Signed)
Thank you for coming in today.   You received an injection today. Seek immediate medical attention if the joint becomes red, extremely painful, or is oozing fluid.   We will see you next week for Gelsyn injection #3

## 2022-07-27 NOTE — Progress Notes (Signed)
Carol May presents to clinic today for Gelsyn injection right knee 2/3  Procedure: Real-time Ultrasound Guided Injection of right knee superior lateral patellar space Device: Philips Affiniti 50G Images permanently stored and available for review in PACS Verbal informed consent obtained.  Discussed risks and benefits of procedure. Warned about infection, bleeding, damage to structures among others. Patient expresses understanding and agreement Time-out conducted.   Noted no overlying erythema, induration, or other signs of local infection.   Skin prepped in a sterile fashion.   Local anesthesia: Topical Ethyl chloride.   With sterile technique and under real time ultrasound guidance: Gelsyn 2 mL injected into knee joint. Fluid seen entering the joint capsule.   Completed without difficulty   Advised to call if fevers/chills, erythema, induration, drainage, or persistent bleeding.   Images permanently stored and available for review in the ultrasound unit.  Impression: Technically successful ultrasound guided injection. Lot number: [id number]  Return in 1 week for Gelsyn injection right knee 3/3

## 2022-08-03 ENCOUNTER — Ambulatory Visit: Payer: Self-pay

## 2022-08-03 ENCOUNTER — Ambulatory Visit (INDEPENDENT_AMBULATORY_CARE_PROVIDER_SITE_OTHER): Payer: Commercial Managed Care - HMO | Admitting: Family Medicine

## 2022-08-03 DIAGNOSIS — G8929 Other chronic pain: Secondary | ICD-10-CM

## 2022-08-03 DIAGNOSIS — M25561 Pain in right knee: Secondary | ICD-10-CM | POA: Diagnosis not present

## 2022-08-03 DIAGNOSIS — M2241 Chondromalacia patellae, right knee: Secondary | ICD-10-CM

## 2022-08-03 MED ORDER — SODIUM HYALURONATE (VISCOSUP) 16.8 MG/2ML IX SOSY
16.8000 mg | PREFILLED_SYRINGE | Freq: Once | INTRA_ARTICULAR | Status: AC
  Administered 2022-08-03: 16.8 mg via INTRA_ARTICULAR

## 2022-08-03 NOTE — Progress Notes (Signed)
Carol May presents to clinic today for Gelsyn injection right knee 3/3  Procedure: Real-time Ultrasound Guided Injection of right knee superior lateral patellar space Device: Philips Affiniti 50G Images permanently stored and available for review in PACS Verbal informed consent obtained.  Discussed risks and benefits of procedure. Warned about infection, bleeding, damage to structures among others. Patient expresses understanding and agreement Time-out conducted.   Noted no overlying erythema, induration, or other signs of local infection.   Skin prepped in a sterile fashion.   Local anesthesia: Topical Ethyl chloride.   With sterile technique and under real time ultrasound guidance: Gelsyn 2 mL injected into knee joint. Fluid seen entering the joint capsule.   Completed without difficulty   Advised to call if fevers/chills, erythema, induration, drainage, or persistent bleeding.   Images permanently stored and available for review in the ultrasound unit.  Impression: Technically successful ultrasound guided injection.   Lot number: [id number]  Return as needed

## 2022-08-03 NOTE — Patient Instructions (Signed)
Thank you for coming in today.   You received an injection today. Seek immediate medical attention if the joint becomes red, extremely painful, or is oozing fluid.   That completes the [place] series.  Check back as needed

## 2022-08-29 ENCOUNTER — Encounter: Payer: Self-pay | Admitting: *Deleted

## 2022-08-31 DIAGNOSIS — M2241 Chondromalacia patellae, right knee: Secondary | ICD-10-CM | POA: Diagnosis not present

## 2022-08-31 DIAGNOSIS — G8929 Other chronic pain: Secondary | ICD-10-CM | POA: Diagnosis not present

## 2022-08-31 MED ORDER — SODIUM HYALURONATE (VISCOSUP) 16.8 MG/2ML IX SOSY
16.8000 mg | PREFILLED_SYRINGE | Freq: Once | INTRA_ARTICULAR | Status: AC
  Administered 2022-08-31: 16.8 mg via INTRA_ARTICULAR

## 2022-08-31 MED ORDER — SODIUM HYALURONATE (VISCOSUP) 16.8 MG/2ML IX SOSY
16.8000 mg | PREFILLED_SYRINGE | Freq: Once | INTRA_ARTICULAR | Status: AC
  Administered 2022-07-27: 16.8 mg via INTRA_ARTICULAR

## 2022-08-31 NOTE — Addendum Note (Signed)
Addended by: Mare Ferrari on: 08/31/2022 03:48 PM   Modules accepted: Orders

## 2022-08-31 NOTE — Addendum Note (Signed)
Addended by: Mare Ferrari on: 08/31/2022 03:51 PM   Modules accepted: Orders

## 2022-09-07 ENCOUNTER — Ambulatory Visit (INDEPENDENT_AMBULATORY_CARE_PROVIDER_SITE_OTHER): Payer: Commercial Managed Care - HMO | Admitting: *Deleted

## 2022-09-07 DIAGNOSIS — Z23 Encounter for immunization: Secondary | ICD-10-CM | POA: Diagnosis not present

## 2022-09-27 ENCOUNTER — Telehealth (INDEPENDENT_AMBULATORY_CARE_PROVIDER_SITE_OTHER): Payer: Commercial Managed Care - HMO | Admitting: Family Medicine

## 2022-09-27 ENCOUNTER — Encounter: Payer: Self-pay | Admitting: Family Medicine

## 2022-09-27 VITALS — Ht 62.0 in | Wt 160.0 lb

## 2022-09-27 DIAGNOSIS — F5081 Binge eating disorder: Secondary | ICD-10-CM

## 2022-09-27 DIAGNOSIS — F321 Major depressive disorder, single episode, moderate: Secondary | ICD-10-CM | POA: Diagnosis not present

## 2022-09-27 DIAGNOSIS — R41840 Attention and concentration deficit: Secondary | ICD-10-CM

## 2022-09-27 DIAGNOSIS — F4323 Adjustment disorder with mixed anxiety and depressed mood: Secondary | ICD-10-CM

## 2022-09-27 NOTE — Progress Notes (Signed)
Virtual Visit via Video Note  Subjective  CC:  Chief Complaint  Patient presents with   Depression     I connected with Legrand Rams on 09/27/22 at  1:30 PM EDT by a video enabled telemedicine application and verified that I am speaking with the correct person using two identifiers. Location patient: Home Location provider: [facility] [address], Office Persons participating in the virtual visit: Angla Delahunt, Willow Ora, MD Trudie Reed CMA  I discussed the limitations of evaluation and management by telemedicine and the availability of in person appointments. The patient expressed understanding and agreed to proceed. HPI: Carol May is a 36 y.o. female who was contacted today to address the problems listed above in the chief complaint. Follow-up depression: Remains stable with Wellbutrin XL 300 daily.  Not as sluggish and depressed.   Has questions regarding ADD: She reports that her son was recently diagnosed with ADD and she started medications for him.  She wants to try the medication first to see how it affected what could affect her son.  On the medication she felt that she had more energy and more focus and is now questioning whether some of her symptoms including poor attention, sluggishness is more related to ADD.  She has never been fully evaluated.  She tends to have more negative depressive symptoms although sometimes she will feel anxious.  She does feel that it is hard to complete tasks at times. Reports binge eating disorder: Has not been evaluated in the past or treated.  Using laxatives for weight control.  Has appointment with me to discuss further.  Wants to know how she can get help.  Assessment  1. Adjustment disorder with mixed anxiety and depressed mood   2. Attention deficit   3. Binge eating disorder   4. Depression, major, single episode, moderate (HCC)      Plan  Depression and anxiety: Much improved on Wellbutrin  XL 300 daily.  Continue Binge eating disorder: Recommend trying to find a eating disorder clinical specialist.  Patient will do some recheck.  Can consider Vyvanse. Possible attention deficit: Complicated given that her anxiety and depression could be giving her some of her symptoms.  Short course of Adderall is mildly helpful but this is not necessarily diagnostic.  Refer to [place] attention specialist.  I discussed the assessment and treatment plan with the patient. The patient was provided an opportunity to ask questions and all were answered. The patient agreed with the plan and demonstrated an understanding of the instructions.   The patient was advised to call back or seek an in-person evaluation if the symptoms worsen or if the condition fails to improve as anticipated. Follow up: As scheduled 10/05/2022  No orders of the defined types were placed in this encounter.     I reviewed the patients updated PMH, FH, and SocHx.    Patient Active Problem List   Diagnosis Date Noted   Bilateral temporomandibular joint pain 03/30/2022   Depression, major, single episode, moderate (HCC) 09/15/2021   Patellofemoral chondrosis of right knee 03/31/2021   Right anterior knee pain 03/31/2021   Adjustment disorder with mixed anxiety and depressed mood 03/10/2021   Cervical dysplasia 01/21/2019   Presence of subcutaneous contraceptive implant 09/12/2018   AR (allergic rhinitis) 04/01/2014   Current Meds  Medication Sig   Boric Acid Vaginal 600 MG SUPP insert ONE capsule vaginally AT bedtime FOR SEVEN DAYS, THEN USE TWO TO THREE times  a WEEK.   buPROPion (WELLBUTRIN XL) 300 MG 24 hr tablet Take 1 tablet (300 mg total) by mouth daily.   UNABLE TO FIND Med Name: Herbal Swiss    Allergies: Patient has No Known Allergies. Family History: Patient family history includes Asthma in her brother; Healthy in her daughter, mother, son, and son; Hyperlipidemia in her father. Social History:  Patient   reports that she has never smoked. She has never used smokeless tobacco. She reports that she does not drink alcohol and does not use drugs.  Review of Systems: Constitutional: Negative for fever malaise or anorexia Cardiovascular: negative for chest pain Respiratory: negative for SOB or persistent cough Gastrointestinal: negative for abdominal pain  OBJECTIVE Vitals: Ht 5\' 2"  (1.575 m)   Wt 160 lb (72.6 kg)   BMI 29.26 kg/m  General: no acute distress , A&Ox3  Leamon Arnt, MD

## 2022-10-05 ENCOUNTER — Ambulatory Visit: Payer: Commercial Managed Care - HMO | Admitting: Family Medicine

## 2022-10-10 ENCOUNTER — Telehealth: Payer: Self-pay | Admitting: Family Medicine

## 2022-10-10 NOTE — Telephone Encounter (Signed)
Patient stated she wasn't able to pick up welbutrin 300 mg on time and she is now requesting a refill - [facility] on file confirmed.

## 2022-10-11 ENCOUNTER — Other Ambulatory Visit: Payer: Self-pay

## 2022-10-11 MED ORDER — BUPROPION HCL ER (XL) 300 MG PO TB24: 300.0000 mg | ORAL_TABLET | Freq: Every day | ORAL | 3 refills | Status: DC

## 2022-10-12 ENCOUNTER — Ambulatory Visit (INDEPENDENT_AMBULATORY_CARE_PROVIDER_SITE_OTHER): Payer: Commercial Managed Care - HMO

## 2022-10-12 DIAGNOSIS — Z23 Encounter for immunization: Secondary | ICD-10-CM

## 2022-10-12 NOTE — Progress Notes (Signed)
Pt in office today to receive MMR vaccine. Pt was given handout regarding MMR vaccine. Administered in Right Deltoid with no issues. Pt tolerated well. Pt asked to receive printout of immunizations and this was also provided to patient for her records.

## 2022-10-16 IMAGING — MR MR KNEE*R* W/O CM
7 series · 40 of 40 positions shown · non-contrast
Comparison: None Available.

CLINICAL DATA: Right knee pain for 2 years.  No known injury.

EXAM:
MRI OF THE RIGHT KNEE WITHOUT CONTRAST
TECHNIQUE: Multiplanar, multisequence MR imaging of the knee was performed. No
intravenous contrast was administered.

[Series 3: T2 fat-sat · axial · 4.0mm · 0.62mm/px · z∈[-101,+54]mm · 5 of 32 slices shown (1 of 3)]
[im 1/32]
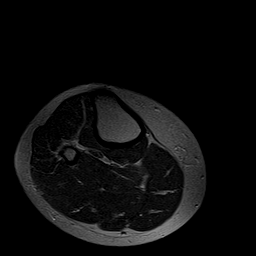
[im 8/32]
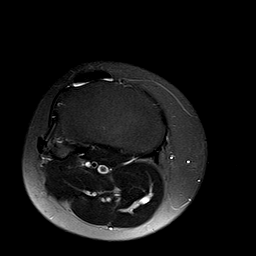
[im 16/32]
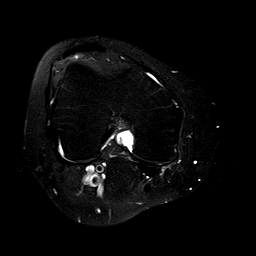
[im 24/32]
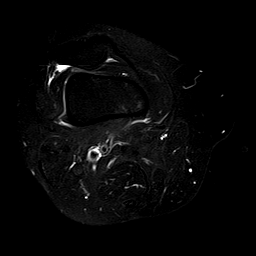
[im 32/32]
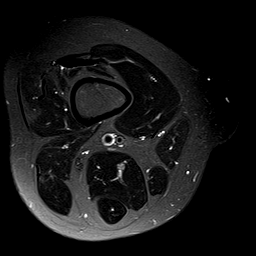

[Series 4: T1 · coronal · 4.0mm · 0.59mm/px · 5 of 30 slices shown]
[im 1/30]
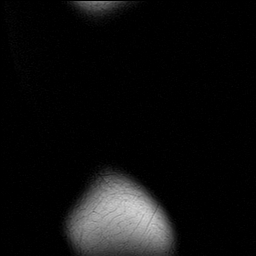
[im 8/30]
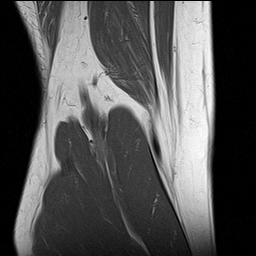
[im 15/30]
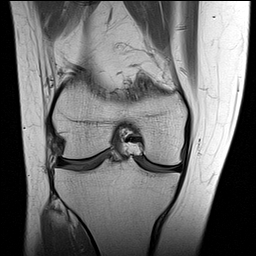
[im 22/30]
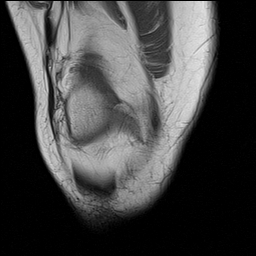
[im 30/30]
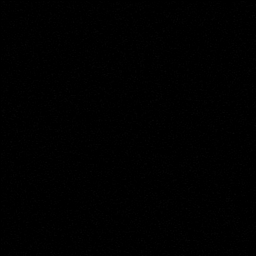

[Series 5: PD fat-sat · sagittal · 3.0mm · 0.62mm/px · 7 of 35 slices shown (1 of 3)]
[im 1/35]
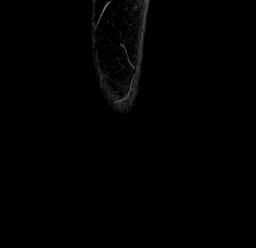
[im 6/35]
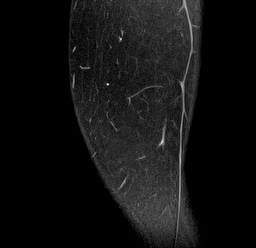
[im 12/35]
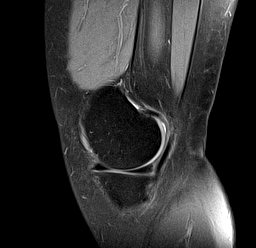
[im 18/35]
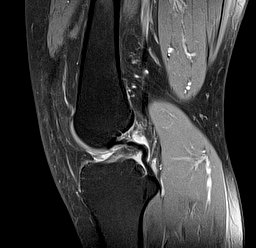
[im 23/35]
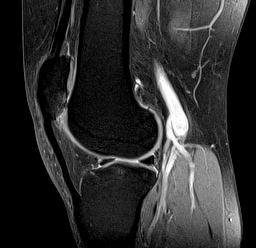
[im 29/35]
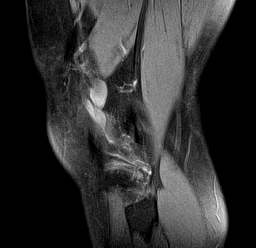
[im 35/35]
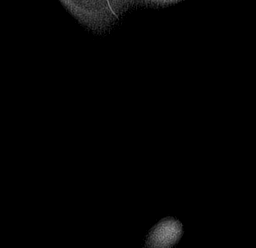

[Series 6: T2 fat-sat · coronal · 4.0mm · 0.59mm/px · 6 of 30 slices shown (2 of 3)]
[im 1/30]
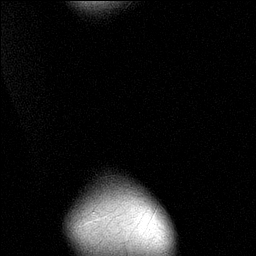
[im 6/30]
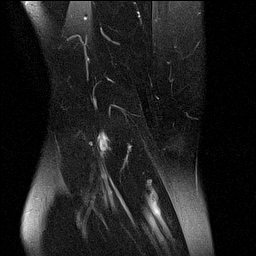
[im 12/30]
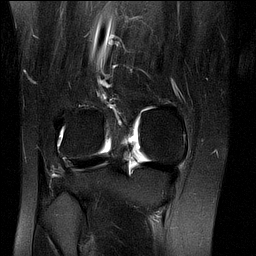
[im 18/30]
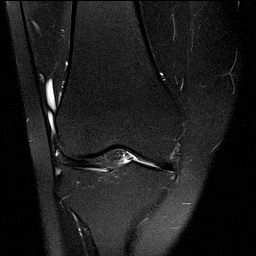
[im 24/30]
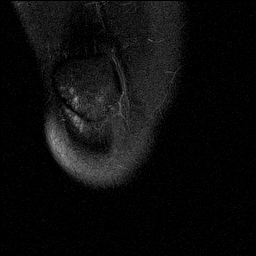
[im 30/30]
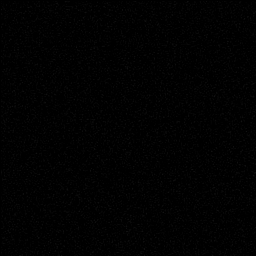

[Series 7: PD fat-sat · coronal · 4.0mm · 0.59mm/px · 6 of 30 slices shown (2 of 3)]
[im 1/30]
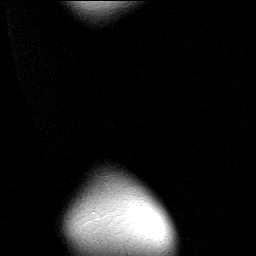
[im 6/30]
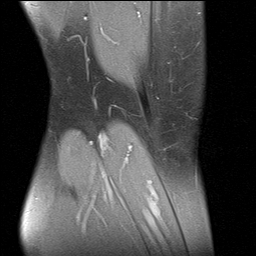
[im 12/30]
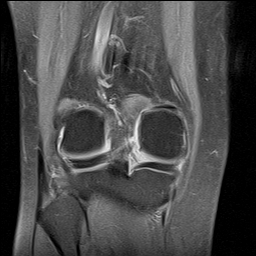
[im 18/30]
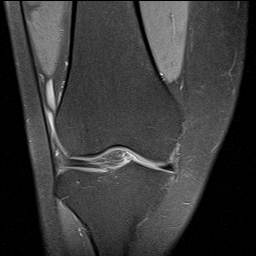
[im 24/30]
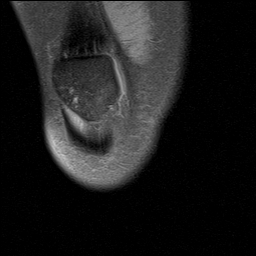
[im 30/30]
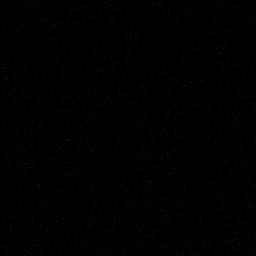

[Series 8: T2 fat-sat · sagittal · 3.0mm · 0.62mm/px · 7 of 35 slices shown (3 of 3)]
[im 1/35]
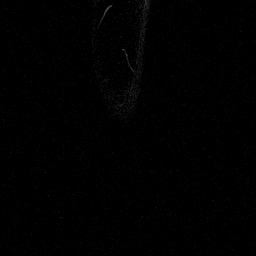
[im 6/35]
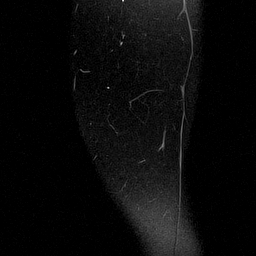
[im 12/35]
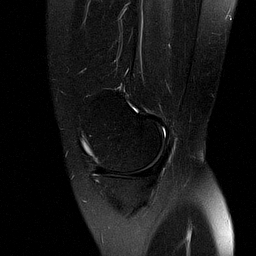
[im 18/35]
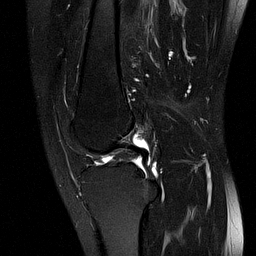
[im 23/35]
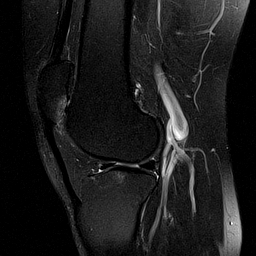
[im 29/35]
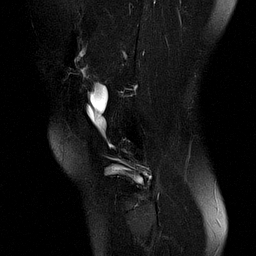
[im 35/35]
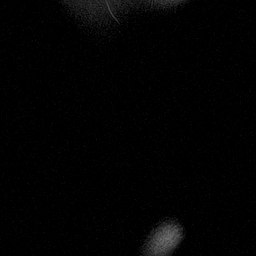

[Series 9: PD fat-sat · coronal · 2.0mm · 0.59mm/px · 4 of 19 slices shown (3 of 3)]
[im 1/19]
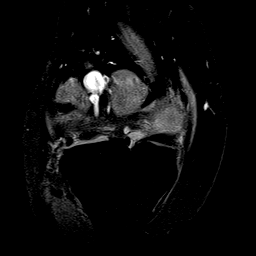
[im 7/19]
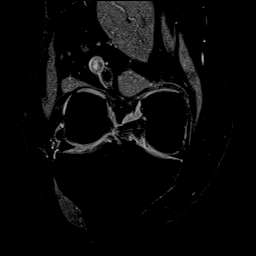
[im 13/19]
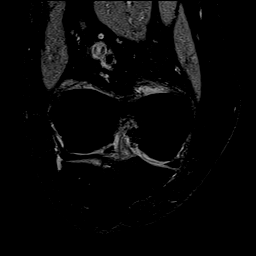
[im 19/19]
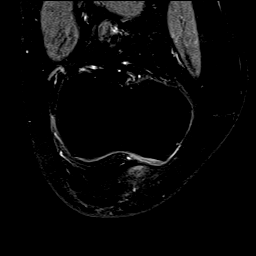

[40 of 40 positions shown; findings below may reference images not displayed]

FINDINGS: MENISCI

Medial: Intact.

Lateral: Intact. Subcortical reactive marrow changes of the root of
the anterior horn of the lateral meniscus.

LIGAMENTS

Cruciates: ACL and PCL are intact.

Collaterals: Medial collateral ligament is intact. Lateral
collateral ligament complex is intact.

CARTILAGE

Patellofemoral: High-grade partial-thickness cartilage loss with
areas of full-thickness cartilage loss of the lateral patellar facet
and lateral trochlea with subchondral reactive marrow edema in the
lateral patellar facet.

Medial: Mild partial-thickness cartilage loss of the medial
femorotibial compartment.

Lateral: Mild partial-thickness cartilage loss of the lateral
femorotibial compartment.

JOINT: No joint effusion. Edema in superolateral Hoffa's fat. No
plical thickening.

POPLITEAL FOSSA: Popliteus tendon is intact. No Baker's cyst.

EXTENSOR MECHANISM: Intact quadriceps tendon. Intact patellar
tendon. Intact lateral patellar retinaculum. Intact medial patellar
retinaculum. Intact MPFL. TT-TG distance 9.3 mm.

BONES: No aggressive osseous lesion. No fracture or dislocation.

Other: No fluid collection or hematoma. Muscles are normal.
IMPRESSION: 1. Tricompartmental cartilage abnormalities as described above most
severe in the lateral patellofemoral compartment.
2. Edema in superolateral Hoffa's fat as can be seen with patellar
tendon-lateral femoral condyle friction syndrome.

## 2022-10-26 ENCOUNTER — Ambulatory Visit: Payer: Commercial Managed Care - HMO | Admitting: Family Medicine

## 2022-11-09 ENCOUNTER — Telehealth: Payer: Self-pay | Admitting: Family Medicine

## 2022-11-09 NOTE — Telephone Encounter (Signed)
Pt is requesting to have a Titers done. Please place orders & send to Admin to schedule.

## 2022-11-10 NOTE — Telephone Encounter (Signed)
Message sent thru MyChart for clarification on Titers

## 2022-11-21 NOTE — Telephone Encounter (Signed)
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  Patient requests to be advised re: the above

## 2022-11-23 ENCOUNTER — Other Ambulatory Visit: Payer: Self-pay

## 2022-11-23 DIAGNOSIS — Z789 Other specified health status: Secondary | ICD-10-CM

## 2022-11-23 NOTE — Telephone Encounter (Signed)
Vml to sch  

## 2022-12-13 ENCOUNTER — Ambulatory Visit: Payer: Commercial Managed Care - HMO | Admitting: Family Medicine

## 2022-12-24 ENCOUNTER — Encounter (HOSPITAL_COMMUNITY): Payer: Self-pay

## 2022-12-24 ENCOUNTER — Ambulatory Visit (HOSPITAL_COMMUNITY)
Admission: EM | Admit: 2022-12-24 | Discharge: 2022-12-24 | Disposition: A | Discharge status: Home / Self Care | Payer: Commercial Managed Care - HMO

## 2022-12-24 DIAGNOSIS — R1031 Right lower quadrant pain: Secondary | ICD-10-CM | POA: Diagnosis not present

## 2022-12-24 LAB — POCT URINALYSIS DIPSTICK, ED / UC
Bilirubin Urine: NEGATIVE
Glucose, UA: NEGATIVE mg/dL
Hgb urine dipstick: NEGATIVE
Ketones, ur: NEGATIVE mg/dL
Leukocytes,Ua: NEGATIVE
Nitrite: NEGATIVE
Protein, ur: NEGATIVE mg/dL
Specific Gravity, Urine: >=1.03 (ref 1.005–1.030)
Urobilinogen, UA: 0.2 mg/dL (ref 0.0–1.0)
pH: 5 (ref 5.0–8.0)

## 2022-12-24 LAB — POC URINE PREG, ED: Preg Test, Ur: NEGATIVE

## 2022-12-24 NOTE — Discharge Instructions (Addendum)
Good to meet you tonight.  Good news is that I think your pain is related to your recent hCG shot and should resolve with time.  You may take Tylenol or ibuprofen as needed for the pain.  You can do ice or warm compresses to the area to help with pain.  Should you develop sudden severe worsening pain, nausea or vomiting, or fever, you will need to present to the emergency department right away.

## 2022-12-24 NOTE — ED Provider Notes (Signed)
[place]   MRN: [id number] DOB: 1986/09/09  Subjective:   Carol May is a 37 y.o. female presenting for right lower abdominal pain that started about three days ago. Feels like a pressure type of pain.  Rates her pain about 4 out of 10 right now.  No other symptoms to report.  No fever or chills.  No nausea or vomiting.  No urinary or stool changes.  No recent intercourse.  No history of STIs.  Last menstrual period was a few weeks ago, denies chance of pregnancy.   She had an hCG injection on 12/19/22 in this area.   No current facility-administered medications for this encounter.  Current Outpatient Medications:    Boric Acid Vaginal 600 MG SUPP, insert ONE capsule vaginally AT bedtime FOR SEVEN DAYS, THEN USE TWO TO THREE times a WEEK., Disp: , Rfl:    buPROPion (WELLBUTRIN XL) 300 MG 24 hr tablet, Take 1 tablet (300 mg total) by mouth daily., Disp: 90 tablet, Rfl: 3   fluticasone (FLONASE) 50 MCG/ACT nasal spray, , Disp: , Rfl:    UNABLE TO FIND, Med Name: Herbal Swiss, Disp: , Rfl:    No Known Allergies  Past Medical History:  Diagnosis Date   AR (allergic rhinitis)    Cervical dysplasia 01/21/2019   CIN 1-2; s/p LEEP 01/2019 Dr. Sandford Craze   H/O varicella      Past Surgical History:  Procedure Laterality Date   COLPOSCOPY  12/19/2018   OPEN REDUCTION INTERNAL FIXATION (ORIF) TIBIA/FIBULA FRACTURE Right 08/11/2016   Procedure: OPEN REDUCTION INTERNAL FIXATION (ORIF) RIGHT TIBIA/FIBULA  PILON FRACTURE;  Surgeon: Wylene Simmer, MD;  Location: [facility];  Service: Orthopedics;  Laterality: Right;    Family History  Problem Relation Age of Onset   Hyperlipidemia Father    Asthma Brother    Healthy Daughter    Healthy Son    Healthy Son    Healthy Mother     Social History   Tobacco Use   Smoking status: Never   Smokeless tobacco: Never  Vaping Use   Vaping Use: Never used  Substance Use Topics   Alcohol use: No   Drug  use: No    ROS REFER TO HPI FOR PERTINENT POSITIVES AND NEGATIVES   Objective:   Vitals: BP 110/70 (BP Location: Left Arm)   Pulse 89   Temp 98.3 F (36.8 C) (Oral)   Resp 18   LMP 12/09/2022   SpO2 98%   Physical Exam Vitals and nursing note reviewed.  Constitutional:      Appearance: She is well-developed.  Cardiovascular:     Rate and Rhythm: Normal rate and regular rhythm.     Heart sounds: No murmur heard. Pulmonary:     Effort: Pulmonary effort is normal.     Breath sounds: Normal breath sounds.  Abdominal:     General: Abdomen is flat. Bowel sounds are normal.     Tenderness: There is abdominal tenderness (minimally TTP RLQ) in the right lower quadrant. There is no right CVA tenderness, left CVA tenderness, guarding or rebound. Negative signs include Murphy's sign, Rovsing's sign, McBurney's sign, psoas sign and obturator sign.     Hernia: No hernia is present.    Neurological:     Mental Status: She is alert.     Results for orders placed or performed during the hospital encounter of 12/24/22 (from the past 24 hour(s))  POC Urinalysis dipstick     Status: None  Collection Time: 12/24/22  6:00 PM  Result Value Ref Range   Glucose, UA NEGATIVE NEGATIVE mg/dL   Bilirubin Urine NEGATIVE NEGATIVE   Ketones, ur NEGATIVE NEGATIVE mg/dL   Specific Gravity, Urine >=1.030 1.005 - 1.030   Hgb urine dipstick NEGATIVE NEGATIVE   pH 5.0 5.0 - 8.0   Protein, ur NEGATIVE NEGATIVE mg/dL   Urobilinogen, UA 0.2 0.0 - 1.0 mg/dL   Nitrite NEGATIVE NEGATIVE   Leukocytes,Ua NEGATIVE NEGATIVE  POC urine pregnancy     Status: None   Collection Time: 12/24/22  6:10 PM  Result Value Ref Range   Preg Test, Ur NEGATIVE NEGATIVE    Assessment and Plan :   PDMP not reviewed this encounter.  1. Right lower quadrant abdominal pain    No red flags noted on exam. This is not an acute abdomen. She is tender over the area of her recent hCG injection. Negative for signs of  appendicitis, although, strict ED precautions discussed with her. Tylenol / ibuprofen, rest, fluids, ice or heat to area at this time. Pt agreeable and understanding of plan.     AllwardtRanda Evens, PA-C 12/24/22 1932

## 2022-12-24 NOTE — ED Triage Notes (Signed)
Pt reports left side abdominal pain and pressure that just 1-2 days ago. Denies any trauma or falls to the area.

## 2022-12-29 ENCOUNTER — Other Ambulatory Visit: Payer: Self-pay | Admitting: Family Medicine

## 2023-01-05 ENCOUNTER — Encounter: Payer: Self-pay | Admitting: Family Medicine

## 2023-02-20 ENCOUNTER — Encounter: Payer: Self-pay | Admitting: Family Medicine

## 2023-02-23 ENCOUNTER — Telehealth: Payer: Self-pay

## 2023-02-23 NOTE — Telephone Encounter (Signed)
Hello I would like to know if we can go ahead and run my insurance for the next gelsyn shot for my right knee?  And due to my previous talk with doc Georgina Snell about both of the knees can we see if insurance will cover the MRI for the left one.. ?  Please  Thanks  Carol May

## 2023-02-24 NOTE — Telephone Encounter (Signed)
VOB initiated for Gelsyn-3 for RIGHT knee OA

## 2023-02-27 NOTE — Telephone Encounter (Signed)
Prior auth required for GELSYN-3    To obtain pre-cert, please contact [phone]. Office also has option to fax the authorization form and clinicals to [phone] or to submit online via [email or web address]

## 2023-03-06 NOTE — Telephone Encounter (Addendum)
Prior Auth initiated via [place]: [id number]

## 2023-03-13 NOTE — Telephone Encounter (Signed)
Scheduled

## 2023-03-13 NOTE — Telephone Encounter (Signed)
GELSYN-3 for RIGHT knee OA  Primary Medical: [employer] HMO Co-Pay: n/a Co-Insurance: 10% Deductible: does not apply  Prior Auth: APPROVED PA# [id number] Valid: 03/06/23-09/05/23

## 2023-03-13 NOTE — Telephone Encounter (Addendum)
GELSYN-3  - RIGHT knee OA  Prior Auth: APPROVED PA# [id number] Valid: 03/06/23-09/05/23

## 2023-03-15 ENCOUNTER — Ambulatory Visit (INDEPENDENT_AMBULATORY_CARE_PROVIDER_SITE_OTHER): Payer: Commercial Managed Care - HMO

## 2023-03-15 ENCOUNTER — Ambulatory Visit (INDEPENDENT_AMBULATORY_CARE_PROVIDER_SITE_OTHER): Payer: Commercial Managed Care - HMO | Admitting: Family Medicine

## 2023-03-15 ENCOUNTER — Encounter: Payer: Self-pay | Admitting: Family Medicine

## 2023-03-15 ENCOUNTER — Other Ambulatory Visit: Payer: Self-pay

## 2023-03-15 VITALS — BP 102/78 | HR 74 | Ht 62.0 in | Wt 168.2 lb

## 2023-03-15 DIAGNOSIS — M25562 Pain in left knee: Secondary | ICD-10-CM

## 2023-03-15 DIAGNOSIS — M2241 Chondromalacia patellae, right knee: Secondary | ICD-10-CM | POA: Diagnosis not present

## 2023-03-15 DIAGNOSIS — G8929 Other chronic pain: Secondary | ICD-10-CM

## 2023-03-15 DIAGNOSIS — M25561 Pain in right knee: Secondary | ICD-10-CM | POA: Diagnosis not present

## 2023-03-15 MED ORDER — SODIUM HYALURONATE (VISCOSUP) 16.8 MG/2ML IX SOSY
16.8000 mg | PREFILLED_SYRINGE | Freq: Once | INTRA_ARTICULAR | Status: AC
  Administered 2023-03-15: 16.8 mg via INTRA_ARTICULAR

## 2023-03-15 NOTE — Progress Notes (Unsigned)
I, Carol May, CMA acting as a scribe for Carol Graham, MD.  Carol May is a 37 y.o. female who presents to [facility] today for continued right knee pain and to review the [place] series.  Patient was last seen by Dr. Denyse Amass on 08/03/2022 and completed the [facility] series, 3/3, and her right knee.  Patient contacted the office on 01/05/2023 inquiring when she could have a repeat Gelsyn injection and was advised shots could be reauthorized after 88-month.   Today, patient reports  worsening of knee pain since last visit. Also having pain in left knee, discuss MRI and possible inj for LEFT knee.   Dx imaging: 05/08/22 R knee MRI             03/10/21 R knee XR  Pertinent review of systems: No fevers or chills  Relevant historical information: Patellofemoral chondrosis right knee   Exam:  BP 102/78   Pulse 74   Ht 5\' 2"  (1.575 m)   Wt 168 lb 3.2 oz (76.3 kg)   SpO2 100%   BMI 30.76 kg/m  General: Well Developed, well nourished, and in no acute distress.   MSK: Right knee: Mild effusion Normal motion. Crepitation present with extension. Intact strength. Stable ligamentous exam.  Left knee normal-appearing Normal motion.  Crepitation with extension.  Intact strength.  Stable ligamentous exam.    Lab and Radiology Results No results found for this or any previous visit (from the past 72 hour(s)). DG Knee AP/LAT W/Sunrise Left  Result Date: 03/15/2023 CLINICAL DATA:  Pain EXAM: LEFT KNEE 3 VIEWS COMPARISON:  None Available. FINDINGS: Minimal degenerative change in the medial compartment with a tiny osteophyte. Mild degenerative changes in the patellofemoral compartment seen on the sunrise view. No joint effusion. No other bony or soft tissue abnormalities. IMPRESSION: Mild degenerative changes in the patellofemoral compartment. Minimal degenerative change in the medial compartment. Electronically Signed   By: Gerome Sam III M.D.   On: 03/15/2023  19:55   I, Carol May, personally (independently) visualized and performed the interpretation of the images attached in this note.   Gelsyn injection right knee 1/3 Procedure: Real-time Ultrasound Guided Injection of right knee superior lateral patellar space Device: Philips Affiniti 50G Images permanently stored and available for review in PACS Verbal informed consent obtained.  Discussed risks and benefits of procedure. Warned about infection, bleeding, damage to structures among others. Patient expresses understanding and agreement Time-out conducted.   Noted no overlying erythema, induration, or other signs of local infection.   Skin prepped in a sterile fashion.   Local anesthesia: Topical Ethyl chloride.   With sterile technique and under real time ultrasound guidance: Gelsyn 2 mL injected into right knee joint. Fluid seen entering the joint capsule.   Completed without difficulty   Advised to call if fevers/chills, erythema, induration, drainage, or persistent bleeding.   Images permanently stored and available for review in the ultrasound unit.  Impression: Technically successful ultrasound guided injection. Lot number: [id number]      Assessment and Plan: 37 y.o. female with right knee pain due to patellofemoral DJD.  Plan for Gelsyn series starting today.  She also has left knee pain which is chronic.  This is also thought to be similar patellofemoral DJD based on crepitations on extension and x-ray findings as above.  Plan for steroid injection next week likely.   PDMP not reviewed this encounter. Orders Placed This Encounter  Procedures   [place] LIMITED JOINT SPACE STRUCTURES  LOW RIGHT(NO LINKED CHARGES)    Order Specific Question:   Reason for Exam (SYMPTOM  OR DIAGNOSIS REQUIRED)    Answer:   right knee pain    Order Specific Question:   Preferred imaging location?    Answer:   [facility] Knee AP/LAT W/Sunrise Left    Standing Status:    Future    Number of Occurrences:   1    Standing Expiration Date:   03/14/2024    Order Specific Question:   Reason for Exam (SYMPTOM  OR DIAGNOSIS REQUIRED)    Answer:   eval left knee pain    Order Specific Question:   Is patient pregnant?    Answer:   No    Order Specific Question:   Preferred imaging location?    Answer:   [facility]   Meds ordered this encounter  Medications   sodium hyaluronate (viscosup) (GELSYN-3) intra-articular injection 16.8 mg     Discussed warning signs or symptoms. Please see discharge instructions. Patient expresses understanding.   The above documentation has been reviewed and is accurate and complete Carol May, M.D.

## 2023-03-15 NOTE — Patient Instructions (Addendum)
Thank you for coming in today.  You received an injection today. Seek immediate medical attention if the joint becomes red, extremely painful, or is oozing fluid.  Please get an Xray today before you leave   Schedule the 2nd Gelsyn injection for next week, and the 3rd for the week following that.

## 2023-03-16 NOTE — Progress Notes (Signed)
Left knee x-ray shows some arthritis changes.

## 2023-03-22 ENCOUNTER — Other Ambulatory Visit: Payer: Self-pay

## 2023-03-22 ENCOUNTER — Ambulatory Visit (INDEPENDENT_AMBULATORY_CARE_PROVIDER_SITE_OTHER): Payer: Commercial Managed Care - HMO | Admitting: Family Medicine

## 2023-03-22 ENCOUNTER — Telehealth: Payer: Self-pay

## 2023-03-22 ENCOUNTER — Encounter: Payer: Self-pay | Admitting: Family Medicine

## 2023-03-22 VITALS — BP 110/76 | HR 101 | Ht 62.0 in | Wt 166.8 lb

## 2023-03-22 DIAGNOSIS — M1711 Unilateral primary osteoarthritis, right knee: Secondary | ICD-10-CM

## 2023-03-22 DIAGNOSIS — M25561 Pain in right knee: Secondary | ICD-10-CM

## 2023-03-22 DIAGNOSIS — M2242 Chondromalacia patellae, left knee: Secondary | ICD-10-CM | POA: Diagnosis not present

## 2023-03-22 DIAGNOSIS — M25562 Pain in left knee: Secondary | ICD-10-CM

## 2023-03-22 DIAGNOSIS — G8929 Other chronic pain: Secondary | ICD-10-CM

## 2023-03-22 DIAGNOSIS — M2241 Chondromalacia patellae, right knee: Secondary | ICD-10-CM | POA: Diagnosis not present

## 2023-03-22 MED ORDER — SODIUM HYALURONATE (VISCOSUP) 16.8 MG/2ML IX SOSY
16.8000 mg | PREFILLED_SYRINGE | Freq: Once | INTRA_ARTICULAR | Status: AC
  Administered 2023-03-22: 16.8 mg via INTRA_ARTICULAR

## 2023-03-22 NOTE — Patient Instructions (Addendum)
Thank you for coming in today.   You received an injection today. Seek immediate medical attention if the joint becomes red, extremely painful, or is oozing fluid.   We will see you next week for the 3rd Gelsyn injection 

## 2023-03-22 NOTE — Progress Notes (Signed)
I, Stevenson Clinch, CMA acting as a scribe for [name], MD.  Carol May is a 37 y.o. female who presents to [facility] at [facility] today for f/u bilat knee pain; 2nd Gelsyn injection right knee and to review her left knee x-ray.  Patient was last seen by Dr. Denyse Amass on 03/15/2023 and was given the first Gelsyn injection in her right knee and imaging was obtained to evaluate her left knee.  Today, patient reports improvement of sx with Gelsyn injections. Denies swelling. Continues to have mechanical sx.   Dx imaging: 03/15/23 L knee XR 05/08/22 R knee MRI             03/10/21 R knee XR  Pertinent review of systems: No fevers or chills  Relevant historical information: Left knee pain   Exam:  BP 110/76   Pulse (!) 101   Ht  (1.575 m)   Wt 166 lb 12.8 oz (75.7 kg)   LMP 03/01/2023   SpO2 98%   BMI 30.51 kg/m  General: Well Developed, well nourished, and in no acute distress.   MSK: Right knee normal-appearing  Left knee normal-appearing no effusion. Normal motion with crepitation.    Lab and Radiology Results  Right knee Gelsyn 2/3 Procedure: Real-time Ultrasound Guided Injection of right knee joint superior lateral patellar space Device: Philips Affiniti 50G Images permanently stored and available for review in PACS Verbal informed consent obtained.  Discussed risks and benefits of procedure. Warned about infection, bleeding, damage to structures among others. Patient expresses understanding and agreement Time-out conducted.   Noted no overlying erythema, induration, or other signs of local infection.   Skin prepped in a sterile fashion.   Local anesthesia: Topical Ethyl chloride.   With sterile technique and under real time ultrasound guidance: Gelsyn 2 mL injected into the joint. Fluid seen entering the joint capsule.   Completed without difficulty   Pain immediately resolved suggesting accurate placement of the medication.   Advised to  call if fevers/chills, erythema, induration, drainage, or persistent bleeding.   Images permanently stored and available for review in the ultrasound unit.  Impression: Technically successful ultrasound guided injection. Lot number: [id number]   Procedure: Real-time Ultrasound Guided Injection of left knee joint superior lateral patellar space Device: Philips Affiniti 50G Images permanently stored and available for review in PACS Verbal informed consent obtained.  Discussed risks and benefits of procedure. Warned about infection, bleeding, hyperglycemia damage to structures among others. Patient expresses understanding and agreement Time-out conducted.   Noted no overlying erythema, induration, or other signs of local infection.   Skin prepped in a sterile fashion.   Local anesthesia: Topical Ethyl chloride.   With sterile technique and under real time ultrasound guidance: 40 mg of Kenalog and 2 m Marcaine injected into knee joint. Fluid seen entering the joint capsule.   Completed without difficulty   Pain immediately resolved suggesting accurate placement of the medication.   Advised to call if fevers/chills, erythema, induration, drainage, or persistent bleeding.   Images permanently stored and available for review in the ultrasound unit.  Impression: Technically successful ultrasound guided injection.   EXAM: LEFT KNEE 3 VIEWS   COMPARISON:  None Available.   FINDINGS: Minimal degenerative change in the medial compartment with a tiny osteophyte. Mild degenerative changes in the patellofemoral compartment seen on the sunrise view. No joint effusion. No other bony or soft tissue abnormalities.   IMPRESSION: Mild degenerative changes in the patellofemoral compartment. Minimal degenerative change  in the medial compartment.     Electronically Signed   By: Gerome Sam III M.D.   On: 03/15/2023 19:55 I, [name], personally (independently) visualized and performed the  interpretation of the images attached in this note.       Assessment and Plan: 37 y.o. female with left knee pain.  This is thought to be due to patellofemoral chondromalacia.  She does have degenerative changes seen on x-ray.  She had a very similar issue with her right knee that ultimately required hyaluronic acid injections.  Will proceed with trial of steroid injection today but go ahead and start the process for authorization of hyaluronic acid injections as I think it is likely that she will require that was.  Right knee routine follow-up for Gelsyn injection 2/3.  This did not contribute to medical complexity billing today. Return 1 week for Gelsyn injection right knee 3/3  PDMP not reviewed this encounter. Orders Placed This Encounter  Procedures   [place] LIMITED JOINT SPACE STRUCTURES LOW RIGHT(NO LINKED CHARGES)    Order Specific Question:   Reason for Exam (SYMPTOM  OR DIAGNOSIS REQUIRED)    Answer:   left knee pain    Order Specific Question:   Preferred imaging location?    Answer:   Adult nurse [facility] ordered this encounter  Medications   sodium hyaluronate (viscosup) (GELSYN-3) intra-articular injection 16.8 mg     Discussed warning signs or symptoms. Please see discharge instructions. Patient expresses understanding.   The above documentation has been reviewed and is accurate and complete [name], M.D.

## 2023-03-22 NOTE — Telephone Encounter (Signed)
Rodolph Bong, MD  Dierdre Searles, CMA Please auth gel shots for left knee. Already approved right knee

## 2023-03-22 NOTE — Telephone Encounter (Addendum)
Gelsyn for RIGHT knee OA  Inj schedule  #1 03/15/23 #2 03/22/23 #3 03/30/23

## 2023-03-27 NOTE — Telephone Encounter (Signed)
VOB initiated for Gelsyn for LEFT knee OA (already received for right knee OA)

## 2023-03-28 NOTE — Telephone Encounter (Signed)
Prior Auth required for Gelsyn-3 for LEFT knee OA

## 2023-03-29 ENCOUNTER — Ambulatory Visit: Payer: Commercial Managed Care - HMO | Admitting: Family Medicine

## 2023-03-29 NOTE — Telephone Encounter (Signed)
Prior auth initiated via [employer]

## 2023-03-30 ENCOUNTER — Other Ambulatory Visit: Payer: Self-pay

## 2023-03-30 ENCOUNTER — Ambulatory Visit (INDEPENDENT_AMBULATORY_CARE_PROVIDER_SITE_OTHER): Payer: Commercial Managed Care - HMO | Admitting: Family Medicine

## 2023-03-30 DIAGNOSIS — M25561 Pain in right knee: Secondary | ICD-10-CM | POA: Diagnosis not present

## 2023-03-30 DIAGNOSIS — M1711 Unilateral primary osteoarthritis, right knee: Secondary | ICD-10-CM | POA: Diagnosis not present

## 2023-03-30 DIAGNOSIS — G8929 Other chronic pain: Secondary | ICD-10-CM | POA: Diagnosis not present

## 2023-03-30 DIAGNOSIS — M2241 Chondromalacia patellae, right knee: Secondary | ICD-10-CM

## 2023-03-30 MED ORDER — SODIUM HYALURONATE (VISCOSUP) 16.8 MG/2ML IX SOSY
16.8000 mg | PREFILLED_SYRINGE | Freq: Once | INTRA_ARTICULAR | Status: AC
Start: 2023-03-30 — End: 2023-03-30
  Administered 2023-03-30: 16.8 mg via INTRA_ARTICULAR

## 2023-03-30 NOTE — Telephone Encounter (Addendum)
Prior auth initiated for GELSYN-3 for LEFT knee OA via [employer].   Prior Auth (EOC) ID:  [id number]

## 2023-03-30 NOTE — Telephone Encounter (Addendum)
Can consider repeat Gelsyn-3 injection for RIGHT knee on or after 09/30/23.   Also checking benefits for LEFT knee Gelsyn inj

## 2023-03-30 NOTE — Progress Notes (Signed)
Antoinett presents to clinic today for Gelsyn injection right knee 3/3  Left knee is feeling better following steroid injection last week. Right knee is feeling a moderately better after 2 of 3 injections.  Procedure: Real-time Ultrasound Guided Injection of right knee joint superior lateral patellar space Device: Philips Affiniti 50G Images permanently stored and available for review in PACS Verbal informed consent obtained.  Discussed risks and benefits of procedure. Warned about infection, bleeding, damage to structures among others. Patient expresses understanding and agreement Time-out conducted.   Noted no overlying erythema, induration, or other signs of local infection.   Skin prepped in a sterile fashion.   Local anesthesia: Topical Ethyl chloride.   With sterile technique and under real time ultrasound guidance: Gelsyn 2 mL injected into knee joint. Fluid seen entering the joint capsule.   Completed without difficulty   Advised to call if fevers/chills, erythema, induration, drainage, or persistent bleeding.   Images permanently stored and available for review in the ultrasound unit.  Impression: Technically successful ultrasound guided injection.  Lot number: [id number]  Return as needed

## 2023-03-30 NOTE — Patient Instructions (Signed)
Thank you for coming in today.   You received an injection today. Seek immediate medical attention if the joint becomes red, extremely painful, or is oozing fluid.   Check back as needed  Communicate ahead of time, so we can authorize the gel shots

## 2023-03-31 ENCOUNTER — Encounter: Payer: Commercial Managed Care - HMO | Admitting: Obstetrics & Gynecology

## 2023-04-05 NOTE — Telephone Encounter (Signed)
Gelsyn-3 APPROVED for LEFT knee OA  PA# [id number] Valid: 03/30/2023- 09/29/2023

## 2023-04-05 NOTE — Telephone Encounter (Signed)
Left message for patient to call back  

## 2023-04-05 NOTE — Telephone Encounter (Signed)
Gelsyn-3 for LEFT knee OA  Primary Insurance: [employer] Co-Pay: $15  Co-Insurance: 10% Deductible: does not apply  Prior Auth: APPROVED PA# [id number] Valid: 03/30/2023- 09/29/2023

## 2023-04-12 ENCOUNTER — Encounter: Payer: Commercial Managed Care - HMO | Admitting: Family Medicine

## 2023-04-19 ENCOUNTER — Ambulatory Visit (INDEPENDENT_AMBULATORY_CARE_PROVIDER_SITE_OTHER): Payer: Commercial Managed Care - HMO | Admitting: Family Medicine

## 2023-04-19 ENCOUNTER — Encounter: Payer: Self-pay | Admitting: Family Medicine

## 2023-04-19 VITALS — BP 100/70 | HR 65 | Temp 98.4°F | Ht 62.0 in | Wt 167.4 lb

## 2023-04-19 DIAGNOSIS — R5383 Other fatigue: Secondary | ICD-10-CM

## 2023-04-19 DIAGNOSIS — M2242 Chondromalacia patellae, left knee: Secondary | ICD-10-CM

## 2023-04-19 DIAGNOSIS — F4323 Adjustment disorder with mixed anxiety and depressed mood: Secondary | ICD-10-CM | POA: Diagnosis not present

## 2023-04-19 DIAGNOSIS — Z975 Presence of (intrauterine) contraceptive device: Secondary | ICD-10-CM | POA: Diagnosis not present

## 2023-04-19 DIAGNOSIS — Z13220 Encounter for screening for lipoid disorders: Secondary | ICD-10-CM | POA: Diagnosis not present

## 2023-04-19 DIAGNOSIS — F321 Major depressive disorder, single episode, moderate: Secondary | ICD-10-CM | POA: Diagnosis not present

## 2023-04-19 DIAGNOSIS — Z0000 Encounter for general adult medical examination without abnormal findings: Secondary | ICD-10-CM

## 2023-04-19 DIAGNOSIS — M2241 Chondromalacia patellae, right knee: Secondary | ICD-10-CM

## 2023-04-19 DIAGNOSIS — Z23 Encounter for immunization: Secondary | ICD-10-CM | POA: Diagnosis not present

## 2023-04-19 LAB — LIPID PANEL
Cholesterol: 215 mg/dL — ABNORMAL HIGH (ref 0–200)
HDL: 62.8 mg/dL (ref >39.00)
LDL Cholesterol: 119 mg/dL — ABNORMAL HIGH (ref 0–99)
NonHDL: 151.71
Total CHOL/HDL Ratio: 3
Triglycerides: 163 mg/dL — ABNORMAL HIGH (ref 0.0–149.0)
VLDL: 32.6 mg/dL (ref 0.0–40.0)

## 2023-04-19 LAB — CBC WITH DIFFERENTIAL/PLATELET
Basophils Absolute: 0 10*3/uL (ref 0.0–0.1)
Basophils Relative: 0.4 % (ref 0.0–3.0)
Eosinophils Absolute: 0.2 10*3/uL (ref 0.0–0.7)
Eosinophils Relative: 1.9 % (ref 0.0–5.0)
HCT: 40 % (ref 36.0–46.0)
Hemoglobin: 13.8 g/dL (ref 12.0–15.0)
Lymphocytes Relative: 21.8 % (ref 12.0–46.0)
Lymphs Abs: 1.7 10*3/uL (ref 0.7–4.0)
MCHC: 34.5 g/dL (ref 30.0–36.0)
MCV: 86.3 fl (ref 78.0–100.0)
Monocytes Absolute: 0.5 10*3/uL (ref 0.1–1.0)
Monocytes Relative: 6.9 % (ref 3.0–12.0)
Neutro Abs: 5.4 10*3/uL (ref 1.4–7.7)
Neutrophils Relative %: 69 % (ref 43.0–77.0)
Platelets: 222 10*3/uL (ref 150.0–400.0)
RBC: 4.64 Mil/uL (ref 3.87–5.11)
RDW: 13 % (ref 11.5–15.5)
WBC: 7.8 10*3/uL (ref 4.0–10.5)

## 2023-04-19 LAB — COMPREHENSIVE METABOLIC PANEL
ALT: 16 U/L (ref 0–35)
AST: 22 U/L (ref 0–37)
Albumin: 4.1 g/dL (ref 3.5–5.2)
Alkaline Phosphatase: 48 U/L (ref 39–117)
BUN: 13 mg/dL (ref 6–23)
CO2: 27 mEq/L (ref 19–32)
Calcium: 9.1 mg/dL (ref 8.4–10.5)
Chloride: 103 mEq/L (ref 96–112)
Creatinine, Ser: 0.72 mg/dL (ref 0.40–1.20)
GFR: 108.86 mL/min (ref >60.00)
Glucose, Bld: 86 mg/dL (ref 70–99)
Potassium: 3.5 mEq/L (ref 3.5–5.1)
Sodium: 137 mEq/L (ref 135–145)
Total Bilirubin: 0.4 mg/dL (ref 0.2–1.2)
Total Protein: 6.8 g/dL (ref 6.0–8.3)

## 2023-04-19 LAB — VITAMIN D 25 HYDROXY (VIT D DEFICIENCY, FRACTURES): VITD: 23.35 ng/mL — ABNORMAL LOW (ref 30.00–100.00)

## 2023-04-19 LAB — VITAMIN B12: Vitamin B-12: 702 pg/mL (ref 211–911)

## 2023-04-19 LAB — TSH: TSH: 1.64 u[IU]/mL (ref 0.35–5.50)

## 2023-04-19 NOTE — Patient Instructions (Signed)
Please return in 12 months for your annual complete physical; please come fasting.   I will release your lab results to you on your MyChart account with further instructions. You may see the results before I do, but when I review them I will send you a message with my report or have my assistant call you if things need to be discussed. Please reply to my message with any questions. Thank you!   Today you were given your 3rd of 3 HPV vaccinations.  This series is now complete  Calcium Intake Recommendations You can take Caltrate Plus twice a day or get it through your diet or other OTC supplements (Viactiv, OsCal etc)  Calcium is a mineral that affects many functions in the body, including: Blood clotting. Blood vessel function. Nerve impulse conduction. Hormone secretion. Muscle contraction. Bone and teeth functions.  Most of your body's calcium supply is stored in your bones and teeth. When your calcium stores are low, you may be at risk for low bone mass, bone loss, and bone fractures. Consuming enough calcium helps to grow healthy bones and teeth and to prevent breakdown over time. It is very important that you get enough calcium if you are: A child undergoing rapid growth. An adolescent girl. A pre- or post-menopausal woman. A woman whose menstrual cycle has stopped due to anorexia nervosa or regular intense exercise. An individual with lactose intolerance or a milk allergy. A vegetarian.  What is my plan? Try to consume the recommended amount of calcium daily based on your age. Depending on your overall health, your health care provider may recommend increased calcium intake. General daily calcium intake recommendations by age are: Birth to 6 months: 200 mg. Infants 7 to 12 months: 260 mg. Children 1 to 3 years: 700 mg. Children 4 to 8 years: 1,000 mg. Children 9 to 13 years: 1,300 mg. Teens 14 to 18 years: 1,300 mg. Adults 19 to 50 years: 1,000 mg. Adult women 51 to 70 years:  1,200 mg. Adult men 51 to 70 years: 1,000 mg. Adults 71 years and older: 1,200 mg. Pregnant and breastfeeding teens: 1,300 mg. Pregnant and breastfeeding adults: 1,000 mg.  What do I need to know about calcium intake? In order for the body to absorb calcium, it needs vitamin D. You can get vitamin D through (we recommend getting [phone] units of Vitamin D daily) Direct exposure of the skin to sunlight. Foods, such as egg yolks, liver, saltwater fish, and fortified milk. Supplements. Consuming too much calcium may cause: Constipation. Decreased absorption of iron and zinc. Kidney stones. Calcium supplements may interact with certain medicines. Check with your health care provider before starting any calcium supplements. Try to get most of your calcium from food. What foods can I eat? Grains  Fortified oatmeal. Fortified ready-to-eat cereals. Fortified frozen waffles. Vegetables Turnip greens. Broccoli. Fruits Fortified orange juice. Meats and Other Protein Sources Canned sardines with bones. Canned salmon with bones. Soy beans. Tofu. Baked beans. Almonds. [place] nuts. Sunflower seeds. Dairy Milk. Yogurt. Cheese. Cottage cheese. Beverages Fortified soy milk. Fortified rice milk. Sweets/Desserts Pudding. Ice Cream. Milkshakes. Blackstrap molasses. The items listed above may not be a complete list of recommended foods or beverages. Contact your dietitian for more options. What foods can affect my calcium intake? It may be more difficult for your body to use calcium or calcium may leave your body more quickly if you consume large amounts of: Sodium. Protein. Caffeine. Alcohol.  This information is not intended to replace  advice given to you by your health care provider. Make sure you discuss any questions you have with your health care provider. Document Released: 07/05/2004 Document Revised: 06/10/2016 Document Reviewed: 04/29/2014 Elsevier Interactive Patient Education  [employer].   If you have any questions or concerns, please don't hesitate to send me a message via MyChart or call the office at [phone]. Thank you for visiting with [place] today! It's our pleasure caring for you.

## 2023-04-19 NOTE — Progress Notes (Signed)
Subjective  Chief Complaint  Patient presents with   Annual Exam    Pt here for Annual exam and is not currently fasting     HPI: Carol May is a 37 y.o. female who presents to [facility] at [place] today for a Female Wellness Visit.  She also has the concerns and/or needs as listed above in the chief complaint. These will be addressed in addition to the Health Maintenance Visit.   Wellness Visit: annual visit with health maintenance review and exam without Pap  HM: 37 year old female reviewed recent GYN visits.  Pap smear current.  History of CIN dysplasia status post LEEP in 2020.  Normal follow-up.  Sees Dr. Ellyn Hack.  Uses Nexplanon for birth control and works well. Chronic disease management visit and/or acute problem visit: Chronic bilateral knee pain per sports medicine.  Reviewed recent notes, getting injections, they are helping Mood disorder: On Wellbutrin XL 300 daily, mood remains very stable.  Feels good.  Home life and work life are good. Complains of some afternoon fatigue.  Sleeps well.  Feels rested upon awakening.  Is a mother of 3 who works full-time.  No symptoms of diabetes, chest pain, shortness of breath or lower extremity edema.  Denies snoring.  Assessment  1. Annual physical exam   2. Depression, major, single episode, moderate (HCC)   3. Presence of subcutaneous contraceptive implant   4. Adjustment disorder with mixed anxiety and depressed mood   5. Patellofemoral chondrosis of left knee   6. Patellofemoral chondrosis of right knee   7. Need for HPV vaccination   8. Fatigue, unspecified type      Plan  Female Wellness Visit: Age appropriate Health Maintenance and Prevention measures were discussed with patient. Included topics are cancer screening recommendations, ways to keep healthy (see AVS) including dietary and exercise recommendations, regular eye and dental care, use of seat belts, and avoidance of moderate alcohol use and  tobacco use.  Screens are current BMI: discussed patient's BMI and encouraged positive lifestyle modifications to help get to or maintain a target BMI. HM needs and immunizations were addressed and ordered. See below for orders. See HM and immunization section for updates.  HPV third dose given today Routine labs and screening tests ordered including cmp, cbc and lipids where appropriate. Discussed recommendations regarding Vit D and calcium supplementation (see AVS)  Chronic disease f/u and/or acute problem visit: (deemed necessary to be done in addition to the wellness visit): Mood: Depression anxiety well-controlled on Wellbutrin XL 300 mg daily. Birth control, Nexplanon.  Stable Knee pain: Per sports medicine.  Gelsyn injections have been effective. Fatigue: Likely just need day or postprandial fatigue but will check lab work to rule out other causes.  Recommend healthy diet and exercise.  No red flag symptoms present   Follow up: 12 months for complete physical  Orders Placed This Encounter  Procedures   HPV 9-valent vaccine,Recombinat   CBC with Differential/Platelet   Comprehensive metabolic panel   Lipid panel   TSH   VITAMIN D 25 Hydroxy (Vit-D Deficiency, Fractures)   Vitamin B12   No orders of the defined types were placed in this encounter.      Body mass index is 30.62 kg/m. Wt Readings from Last 3 Encounters:  04/19/23 167 lb 6.4 oz (75.9 kg)  03/22/23 166 lb 12.8 oz (75.7 kg)  03/15/23 168 lb 3.2 oz (76.3 kg)    Patient Active Problem List   Diagnosis Date Noted  Depression, major, single episode, moderate (HCC) 09/15/2021    Priority: High   Adjustment disorder with mixed anxiety and depressed mood 03/10/2021    Priority: High    Stable on wellbutrin; failed lexapro 10 (nausea), 2022    Patellofemoral chondrosis of left knee 03/22/2023    Priority: Medium    Bilateral temporomandibular joint pain 03/30/2022    Priority: Medium    Patellofemoral  chondrosis of right knee 03/31/2021    Priority: Medium    Cervical dysplasia 01/21/2019    Priority: Medium     CIN 1-2; s/p LEEP 01/2019 Dr. Hinton Rao    Presence of subcutaneous contraceptive implant 09/12/2018    Priority: Medium     March 2022; nexplanon #3, Dr. Ellyn Hack    Allergic rhinitis 04/01/2014    Priority: Low   Health Maintenance  Topic Date Due   INFLUENZA VACCINE  07/06/2023   PAP SMEAR-Modifier  02/03/2024   DTaP/Tdap/Td (4 - Td or Tdap) 11/14/2024   HPV VACCINES  Completed   Hepatitis C Screening  Completed   HIV Screening  Completed   COVID-19 Vaccine  Discontinued   Immunization History  Administered Date(s) Administered   HPV 9-valent 04/09/2015, 06/09/2015, 04/19/2023   Hepatitis B, PED/ADOLESCENT 08/29/2000, 10/02/2000, 02/21/2001   Influenza Inj Mdck Quad Pf 09/15/2016   Influenza, Seasonal, Injecte, Preservative Fre 09/22/2016   Influenza,inj,Quad PF,6+ Mos 09/15/2021, 09/07/2022   Influenza-Unspecified 09/15/2016, 09/12/2018, 11/05/2020   MMR 07/28/1994, 04/11/2003, 10/12/2022   PPD Test 02/27/2017   Tdap 04/22/2012, 09/13/2014, 11/14/2014   Varicella 04/11/2003   We updated and reviewed the patient's past history in detail and it is documented below. Allergies: Patient  reports no history of alcohol use. Past Medical History Patient  has a past medical history of AR (allergic rhinitis), Cervical dysplasia (01/21/2019), and H/O varicella. Past Surgical History Patient  has a past surgical history that includes Open reduction internal fixation (orif) tibia/fibula fracture (Right, 08/11/2016) and Colposcopy (12/19/2018). Social History   Socioeconomic History   Marital status: Single    Spouse name: lives with boyfriend   Number of children: 3   Years of education: Not on file   Highest education level: Not on file  Occupational History   Occupation: dental hygeinist    Employer: [employer]  Tobacco Use   Smoking status: Never    Smokeless tobacco: Never  Vaping Use   Vaping Use: Never used  Substance and Sexual Activity   Alcohol use: No   Drug use: No   Sexual activity: Yes    Birth control/protection: Implant  Other Topics Concern   Not on file  Social History Narrative   Not on file   Social Determinants of Health   Financial Resource Strain: Not on file  Food Insecurity: Not on file  Transportation Needs: Not on file  Physical Activity: Not on file  Stress: Not on file  Social Connections: Not on file   Family History  Problem Relation Age of Onset   Hyperlipidemia Father    Asthma Brother    Healthy Daughter    Healthy Son    Healthy Son    Healthy Mother     Review of Systems: Constitutional: negative for fever or malaise Ophthalmic: negative for photophobia, double vision or loss of vision Cardiovascular: negative for chest pain, dyspnea on exertion, or new LE swelling Respiratory: negative for SOB or persistent cough Gastrointestinal: negative for abdominal pain, change in bowel habits or melena Genitourinary: negative for dysuria or gross hematuria, no  abnormal uterine bleeding or disharge Musculoskeletal: negative for new gait disturbance or muscular weakness Integumentary: negative for new or persistent rashes, no breast lumps Neurological: negative for TIA or stroke symptoms Psychiatric: negative for SI or delusions Allergic/Immunologic: negative for hives  Patient Care Team    Relationship Specialty Notifications Start End  Willow Ora, MD PCP - General Family Medicine  08/20/18   Sherian Rein, MD Consulting Physician Obstetrics and Gynecology  03/10/21   Rodolph Bong, MD Consulting Physician Family Medicine  02/02/22     Objective  Vitals: BP 100/70   Pulse 65   Temp 98.4 F (36.9 C)   Ht 5\' 2"  (1.575 m)   Wt 167 lb 6.4 oz (75.9 kg)   SpO2 98%   BMI 30.62 kg/m  General:  Well developed, well nourished, no acute distress  Psych:  Alert and orientedx3,normal  mood and affect HEENT:  Normocephalic, atraumatic, non-icteric sclera, PERRL, supple neck without adenopathy, mass or thyromegaly Cardiovascular:  Normal S1, S2, RRR without gallop, rub or murmur Respiratory:  Good breath sounds bilaterally, CTAB with normal respiratory effort Gastrointestinal: normal bowel sounds, soft, non-tender, no noted masses. No HSM MSK: no deformities, contusions. Joints are without erythema or swelling.  Skin:  Warm, no rashes or suspicious lesions noted Neurologic:    Mental status is normal. Gross motor and sensory exams are normal. Normal gait. No tremor    Commons side effects, risks, benefits, and alternatives for medications and treatment plan prescribed today were discussed, and the patient expressed understanding of the given instructions. Patient is instructed to call or message via MyChart if he/she has any questions or concerns regarding our treatment plan. No barriers to understanding were identified. We discussed Red Flag symptoms and signs in detail. Patient expressed understanding regarding what to do in case of urgent or emergency type symptoms.  Medication list was reconciled, printed and provided to the patient in AVS. Patient instructions and summary information was reviewed with the patient as documented in the AVS. This note was prepared with assistance of Dragon voice recognition software. Occasional wrong-word or sound-a-like substitutions may have occurred due to the inherent limitations of voice recognition software .

## 2023-04-23 NOTE — Progress Notes (Signed)
See my chart note.

## 2023-04-26 NOTE — Telephone Encounter (Signed)
Duplicate encounter - not due for repeat until 09/2023.

## 2023-08-16 ENCOUNTER — Ambulatory Visit: Payer: Commercial Managed Care - HMO | Admitting: *Deleted

## 2023-08-16 ENCOUNTER — Telehealth: Payer: Self-pay | Admitting: Family Medicine

## 2023-08-16 DIAGNOSIS — Z23 Encounter for immunization: Secondary | ICD-10-CM

## 2023-08-16 NOTE — Telephone Encounter (Signed)
Pt requesting to have Immunization record sent via email. Please place at front office to send.

## 2023-08-16 NOTE — Telephone Encounter (Signed)
Immunization Record has been printed off and given to [place] to send .

## 2023-08-21 NOTE — Telephone Encounter (Signed)
VOB initiated for GELSYN for LEFT knee OA.

## 2023-08-22 NOTE — Telephone Encounter (Signed)
Noted  

## 2023-08-22 NOTE — Telephone Encounter (Addendum)
Prior Auth REQUIRED for Gelsyn (BILAT knees)

## 2023-08-22 NOTE — Telephone Encounter (Signed)
Patient called for an update. Requested we fax the paperwork since she gets off work @ time office closes. I took down fax number [phone] that patient provided. Immunization records have been faxed and came back successful status.

## 2023-08-28 NOTE — Telephone Encounter (Signed)
Prior auth initiated via [employer] Prior Auth (EOC) ID:  [id number]

## 2023-08-31 NOTE — Telephone Encounter (Signed)
Prior [employer] APPROVED for [place]  PA# [id number] Valid: 09/30/23-03/30/24

## 2023-08-31 NOTE — Telephone Encounter (Signed)
Holding until needed.

## 2023-08-31 NOTE — Telephone Encounter (Addendum)
GELSYN for BILAT knee OA  (OK to schedule on or after 09/30/23)  Primary Insurance: [employer] HMO Co-pay: $15 Co-insurance: 10% Deductible: does not apply Prior Auth: APPROVED PA# [id number] Valid: 09/30/23-03/30/24  Knee Injection History 02/09/22 - Kenalog RIGHT 07/20/22 - Gelsyn #1 RIGHT 07/27/22 - Gelsyn #2 RIGHT 08/03/22 - Gelsyn #3 RIGHT 03/15/23 - Gelsyn #1 RIGHT 03/22/23 - Gelsyn #2 RIGHT 03/30/23 - Gelsyn #3 RIGHT

## 2023-10-04 ENCOUNTER — Other Ambulatory Visit: Payer: Self-pay

## 2023-10-04 ENCOUNTER — Ambulatory Visit (INDEPENDENT_AMBULATORY_CARE_PROVIDER_SITE_OTHER): Payer: Managed Care, Other (non HMO) | Admitting: Family Medicine

## 2023-10-04 ENCOUNTER — Encounter: Payer: Self-pay | Admitting: Family Medicine

## 2023-10-04 VITALS — BP 110/80 | HR 98 | Ht 62.0 in | Wt 158.0 lb

## 2023-10-04 DIAGNOSIS — M25561 Pain in right knee: Secondary | ICD-10-CM

## 2023-10-04 DIAGNOSIS — M1711 Unilateral primary osteoarthritis, right knee: Secondary | ICD-10-CM | POA: Diagnosis not present

## 2023-10-04 DIAGNOSIS — G8929 Other chronic pain: Secondary | ICD-10-CM | POA: Diagnosis not present

## 2023-10-04 MED ORDER — SODIUM HYALURONATE (VISCOSUP) 16.8 MG/2ML IX SOSY
16.8000 mg | PREFILLED_SYRINGE | Freq: Once | INTRA_ARTICULAR | Status: DC
Start: 2023-10-04 — End: 2023-10-04

## 2023-10-04 MED ORDER — SODIUM HYALURONATE (VISCOSUP) 16.8 MG/2ML IX SOSY
16.8000 mg | PREFILLED_SYRINGE | Freq: Once | INTRA_ARTICULAR | Status: AC
Start: 2023-10-04 — End: 2023-10-04
  Administered 2023-10-04: 16.8 mg via INTRA_ARTICULAR

## 2023-10-04 NOTE — Patient Instructions (Signed)
Thank you for coming in today.   You received an injection today. Seek immediate medical attention if the joint becomes red, extremely painful, or is oozing fluid.   Schedule the 2nd Gelsyn injection for next week and the 3rd for the week after that.

## 2023-10-04 NOTE — Progress Notes (Signed)
   I, Stevenson Clinch, C acting as a scribe for [name], MD.  Carol May is a 37 y.o. female who presents to [facility] at [facility] today for cont'd bilat knee pain. Pt was last seen by Dr. Denyse Amass on 03/30/23 and completed the Gelsyn series, 3/3, in her R knee.  Today, pt reports continued mechanical sx in both knees. Has been using organic caster oil with good relief of sx. Has also been working on weight loss. Denies visible swelling in the knees but the knees feel tight at times.  She notes that her right knee is somewhat painful.  Her left knee is not bothering her at this time.  Both knees are approved for Gelsyn.  Dx imaging: 03/15/23 L knee XR 05/08/22 R knee MRI             03/10/21 R knee XR   Pertinent review of systems: No fevers or chills  Relevant historical information: Depression   Exam:  BP 110/80   Pulse 98   Ht 5\' 2"  (1.575 m)   Wt 158 lb (71.7 kg)   SpO2 98%   BMI 28.90 kg/m  General: Well Developed, well nourished, and in no acute distress.   MSK: Right knee mild effusion normal motion with crepitation.    Lab and Radiology Results  Carol May presents to clinic today for Gelsyn injection right knee 1/3 Procedure: Real-time Ultrasound Guided Injection of right knee joint superior lateral patellar space Device: Philips Affiniti 50G/GE Logiq Images permanently stored and available for review in PACS Verbal informed consent obtained.  Discussed risks and benefits of procedure. Warned about infection, bleeding, damage to structures among others. Patient expresses understanding and agreement Time-out conducted.   Noted no overlying erythema, induration, or other signs of local infection.   Skin prepped in a sterile fashion.   Local anesthesia: Topical Ethyl chloride.   With sterile technique and under real time ultrasound guidance: Gelsyn 2 mL injected into knee joint. Fluid seen entering the joint capsule.   Completed without  difficulty   Advised to call if fevers/chills, erythema, induration, drainage, or persistent bleeding.   Images permanently stored and available for review in the ultrasound unit.  Impression: Technically successful ultrasound guided injection. Lot number: [id number]      Assessment and Plan: 37 y.o. female with right knee pain due to DJD.  Plan for Gelsyn series started today.  We decided not to Gelsyn injections left knee because is not hurting.   PDMP not reviewed this encounter. Orders Placed This Encounter  Procedures   [place] LIMITED JOINT SPACE STRUCTURES LOW BILAT(NO LINKED CHARGES)    Order Specific Question:   Reason for Exam (SYMPTOM  OR DIAGNOSIS REQUIRED)    Answer:   bilat knee pain    Order Specific Question:   Preferred imaging location?    Answer:   [facility] ordered this encounter  Medications   sodium hyaluronate (viscosup) (GELSYN-3) intra-articular injection 16.8 mg   sodium hyaluronate (viscosup) (GELSYN-3) intra-articular injection 16.8 mg     Discussed warning signs or symptoms. Please see discharge instructions. Patient expresses understanding.   The above documentation has been reviewed and is accurate and complete [name], M.D.

## 2023-10-05 NOTE — Telephone Encounter (Addendum)
Gelsyn Injection Series: 09/27/23 Unk Lightning #1 10/10/23 Unk Lightning #2 10/18/23 Unk Lightning #3  Can consider repeat injection series on or after 04/17/24

## 2023-10-10 ENCOUNTER — Other Ambulatory Visit: Payer: Self-pay

## 2023-10-10 ENCOUNTER — Ambulatory Visit (INDEPENDENT_AMBULATORY_CARE_PROVIDER_SITE_OTHER): Payer: Managed Care, Other (non HMO) | Admitting: Family Medicine

## 2023-10-10 ENCOUNTER — Ambulatory Visit: Payer: Managed Care, Other (non HMO)

## 2023-10-10 ENCOUNTER — Encounter: Payer: Self-pay | Admitting: Family Medicine

## 2023-10-10 DIAGNOSIS — M2241 Chondromalacia patellae, right knee: Secondary | ICD-10-CM | POA: Diagnosis not present

## 2023-10-10 DIAGNOSIS — M1711 Unilateral primary osteoarthritis, right knee: Secondary | ICD-10-CM | POA: Diagnosis not present

## 2023-10-10 DIAGNOSIS — M25561 Pain in right knee: Secondary | ICD-10-CM

## 2023-10-10 DIAGNOSIS — G8929 Other chronic pain: Secondary | ICD-10-CM | POA: Diagnosis not present

## 2023-10-10 MED ORDER — SODIUM HYALURONATE (VISCOSUP) 16.8 MG/2ML IX SOSY
16.8000 mg | PREFILLED_SYRINGE | Freq: Once | INTRA_ARTICULAR | Status: AC
Start: 2023-10-10 — End: 2023-10-10
  Administered 2023-10-10: 16.8 mg via INTRA_ARTICULAR

## 2023-10-10 NOTE — Patient Instructions (Signed)
Thank you for coming in today.   You received an injection today. Seek immediate medical attention if the joint becomes red, extremely painful, or is oozing fluid.   We will see you next week for the 3rd Gelsyn injection 

## 2023-10-10 NOTE — Progress Notes (Signed)
Carol May presents to clinic today for Gelsyn injection right knee to/3 Procedure: Real-time Ultrasound Guided Injection of right knee joint superior lateral patellar space Device: Philips Affiniti 50G/GE Logiq Images permanently stored and available for review in PACS Verbal informed consent obtained.  Discussed risks and benefits of procedure. Warned about infection, bleeding, damage to structures among others. Patient expresses understanding and agreement Time-out conducted.   Noted no overlying erythema, induration, or other signs of local infection.   Skin prepped in a sterile fashion.   Local anesthesia: Topical Ethyl chloride.   With sterile technique and under real time ultrasound guidance: Gelsyn 2 mL injected into knee joint. Fluid seen entering the joint capsule.   Completed without difficulty   Advised to call if fevers/chills, erythema, induration, drainage, or persistent bleeding.   Images permanently stored and available for review in the ultrasound unit.  Impression: Technically successful ultrasound guided injection. Lot number: [id number]  Will get knee x-rays updated today as well.

## 2023-10-18 ENCOUNTER — Other Ambulatory Visit: Payer: Self-pay

## 2023-10-18 ENCOUNTER — Ambulatory Visit (INDEPENDENT_AMBULATORY_CARE_PROVIDER_SITE_OTHER): Payer: Managed Care, Other (non HMO) | Admitting: Family Medicine

## 2023-10-18 DIAGNOSIS — G8929 Other chronic pain: Secondary | ICD-10-CM | POA: Diagnosis not present

## 2023-10-18 DIAGNOSIS — M1711 Unilateral primary osteoarthritis, right knee: Secondary | ICD-10-CM | POA: Diagnosis not present

## 2023-10-18 DIAGNOSIS — M25561 Pain in right knee: Secondary | ICD-10-CM

## 2023-10-18 DIAGNOSIS — M2241 Chondromalacia patellae, right knee: Secondary | ICD-10-CM

## 2023-10-18 MED ORDER — SODIUM HYALURONATE (VISCOSUP) 16.8 MG/2ML IX SOSY
16.8000 mg | PREFILLED_SYRINGE | Freq: Once | INTRA_ARTICULAR | Status: AC
Start: 2023-10-18 — End: 2023-10-18
  Administered 2023-10-18: 16.8 mg via INTRA_ARTICULAR

## 2023-10-18 NOTE — Progress Notes (Signed)
Carol May presents to clinic today for Gelsyn injection right knee 3/3 Procedure: Real-time Ultrasound Guided Injection of right knee joint superior lateral patellar space Device: Philips Affiniti 50G/GE Logiq Images permanently stored and available for review in PACS Verbal informed consent obtained.  Discussed risks and benefits of procedure. Warned about infection, bleeding, damage to structures among others. Patient expresses understanding and agreement Time-out conducted.   Noted no overlying erythema, induration, or other signs of local infection.   Skin prepped in a sterile fashion.   Local anesthesia: Topical Ethyl chloride.   With sterile technique and under real time ultrasound guidance: Gelsyn 2 mL injected into knee joint. Fluid seen entering the joint capsule.   Completed without difficulty   Advised to call if fevers/chills, erythema, induration, drainage, or persistent bleeding.   Images permanently stored and available for review in the ultrasound unit.  Impression: Technically successful ultrasound guided injection. Lot number: [id number]

## 2023-10-18 NOTE — Patient Instructions (Signed)
Thank you for coming in today.   You received an injection today. Seek immediate medical attention if the joint becomes red, extremely painful, or is oozing fluid.  

## 2023-10-30 NOTE — Progress Notes (Signed)
Right knee x-ray shows mild arthritis that looks a little bit worse than the last time I looked

## 2023-11-24 ENCOUNTER — Other Ambulatory Visit: Payer: Self-pay | Admitting: Family Medicine

## 2023-12-21 NOTE — Telephone Encounter (Signed)
 VOB initiated for GELSYN for RIGHT knee OA

## 2024-01-01 ENCOUNTER — Encounter: Payer: Self-pay | Admitting: Family Medicine

## 2024-01-01 ENCOUNTER — Ambulatory Visit (INDEPENDENT_AMBULATORY_CARE_PROVIDER_SITE_OTHER): Payer: Commercial Managed Care - HMO

## 2024-01-01 ENCOUNTER — Ambulatory Visit: Payer: Self-pay | Admitting: Family Medicine

## 2024-01-01 ENCOUNTER — Ambulatory Visit (INDEPENDENT_AMBULATORY_CARE_PROVIDER_SITE_OTHER): Payer: Commercial Managed Care - HMO | Admitting: Family Medicine

## 2024-01-01 VITALS — BP 112/80 | HR 102 | Ht 62.0 in | Wt 162.8 lb

## 2024-01-01 DIAGNOSIS — Z9889 Other specified postprocedural states: Secondary | ICD-10-CM | POA: Diagnosis not present

## 2024-01-01 DIAGNOSIS — S99811A Other specified injuries of right ankle, initial encounter: Secondary | ICD-10-CM

## 2024-01-01 NOTE — Telephone Encounter (Signed)
Noted

## 2024-01-01 NOTE — Telephone Encounter (Signed)
Copied from CRM [id number]. Topic: Clinical - Red Word Triage >> Jan 01, 2024 10:49 AM Carol May wrote: Red Word that prompted transfer to Nurse Triage: Patient sprang her right ankle last night and it is now swollen and she is in pain/ can't walk on it. Chief Complaint: Ankle injury Symptoms: Pain, swelling, bruising Frequency: Last night Pertinent Negatives: Patient denies relief Disposition: [] ED /[] Urgent Care (no appt availability in office) / [x] Appointment(In office/virtual)/ []  [facility]/ [] Home Care/ [] Refused Recommended Disposition /[] [facility]/ []  Follow-up with PCP Additional Notes: Patient called in to report an ankle injury. Patient stated she sprung her ankle last night when she tripped over a curb at a friend's house. Patient stated her right ankle is bruised and swollen. Patient stated that she is unable to bear weight on the ankle, due to the pain. Patient stated that she has had previous surgeries on this ankle. Patient stated that she has been applying ice and taking OTC pain medication and denied relief. Advised patient to be seen within 4 hours. Scheduled patient with PCP in office for today. Imaging available in office, per [facility]. Advised patient to call back if symptoms worsen.  Reason for Disposition  [1] SEVERE pain AND [2] not improved 2 hours after pain medicine/ice packs  Answer Assessment - Initial Assessment Questions 1. MECHANISM: "How did the injury happen?" (e.g., twisting injury, direct blow)      States she sprung her ankle when she was walking down a hill and tripped over a curb  2. ONSET: "When did the injury happen?" (Minutes or hours ago)      Last night  3. LOCATION: "Where is the injury located?"      Right ankle  4. APPEARANCE of INJURY: "What does the injury look like?"      Swollen and bruised  5. WEIGHT-BEARING: "Can you put weight on that foot?" "Can you walk (four steps or more)?"       States she cannot walk on this  foot  6. SIZE: For cuts, bruises, or swelling, ask: "How large is it?" (e.g., inches or centimeters;  entire joint)      Currently swollen  7. PAIN: "Is there pain?" If Yes, ask: "How bad is the pain?"    (e.g., Scale 1-10; or mild, moderate, severe)   - NONE (0): no pain.   - MILD (1-3): doesn't interfere with normal activities.    - MODERATE (4-7): interferes with normal activities (e.g., work or school) or awakens from sleep, limping.    - SEVERE (8-10): excruciating pain, unable to do any normal activities, unable to walk.      States pain is a 10  Protocols used: Ankle and Foot Injury-A-AH

## 2024-01-01 NOTE — Patient Instructions (Addendum)
Please follow up as scheduled for your next visit with me: 04/24/2024   If you have any questions or concerns, please don't hesitate to send me a message via MyChart or call the office at [phone]. Thank you for visiting with [place] today! It's our pleasure caring for you.   VISIT SUMMARY:  Today, we discussed your recent ankle injury following two falls. You reported severe pain on the inside of your ankle, difficulty bearing weight, and a history of previous ankle surgery. We reviewed your symptoms and planned the next steps for your care.  YOUR PLAN:  -ACUTE ANKLE INJURY: An acute ankle injury means a sudden injury to the ankle, often from a fall or twist. Given your symptoms and history of previous surgery, we need to determine if there is a fracture or an issue with the hardware from your past surgery. We will order x-rays to assess the extent of the injury. In the meantime, you will be provided with a boot for support. If necessary, we may need to get you back in to see Dr. Denyse Amass, for further evaluation.  INSTRUCTIONS:  Please get the ankle x-rays done as soon as possible. Use the boot for support and continue using crutches to avoid putting weight on the injured ankle. If the pain persists or worsens, or if the x-rays show any complications, we will refer you to Dr. Denyse Amass for further evaluation.

## 2024-01-01 NOTE — Progress Notes (Signed)
Subjective  CC:  Chief Complaint  Patient presents with   Ankle Injury    Pt stated that she injuried her Rt snkle lasting night in her driveway where it was not level    HPI: Carol May is a 38 y.o. female who presents to the office today to address the problems listed above in the chief complaint. Discussed the use of AI scribe software for clinical note transcription with the patient, who gave verbal consent to proceed.  History of Present Illness   The patient with a history of previous ankle surgery, presented with a complaint of ankle pain following two recent falls. The first fall occurred a week prior on ice, resulting in a bruise on the hip and an unspecified location. The second fall occurred the day before the consultation (DOI 1/26) when the patient's foot slipped on uneven ground between a grassy area and a driveway. The patient reported that the ankle twisted outward during the fall.  The patient reported severe pain on the inside of the ankle, which was inconsistent with the typical pain location for an outward ankle twist. The patient was unable to bear weight on the affected ankle and had been using crutches for mobility. Despite attempts to elevate and ice the ankle, the patient reported persistent pain and a feeling that "something in there is not right."  The patient had a history of ankle surgery in 2016, which involved the placement of four plates and two screws. The patient reported no pain on the outside of the ankle or in the typical location for a sprained ankle. The patient also reported driving with the affected foot, which caused pain when pressing down on the gas pedal. The patient was under the care of an orthopedic doctor for sports medicine but not the surgeon who performed the previous ankle surgery.       Assessment  1. Supination-eversion injury of right ankle, initial encounter   2. History of ankle surgery      Plan  Assessment and Plan     Acute Ankle Injury Acute left ankle injury following a fall on December 31, 2023. Reports medial ankle pain, difficulty bearing weight, and requires crutches. History of ankle surgery in 2016 with plates and screws. Physical exam reveals medial tenderness, no significant lateral pain. Differential includes fracture or hardware-related injury. X-rays needed to assess injury extent. Discussed potential boot for support and cost considerations. Option to consult orthopedic specialist, Dr. Denyse Amass, if necessary. - Order ankle x-rays - Provide boot for support - Consider referral to orthopedic specialist, Dr. Denyse Amass, if needed.       Orders Placed This Encounter  Procedures   DG Ankle Complete Right   No orders of the defined types were placed in this encounter.    I reviewed the patients updated PMH, FH, and SocHx.    Patient Active Problem List   Diagnosis Date Noted   Depression, major, single episode, moderate (HCC) 09/15/2021    Priority: High   Adjustment disorder with mixed anxiety and depressed mood 03/10/2021    Priority: High   Patellofemoral chondrosis of left knee 03/22/2023    Priority: Medium    Bilateral temporomandibular joint pain 03/30/2022    Priority: Medium    Patellofemoral chondrosis of right knee 03/31/2021    Priority: Medium    Cervical dysplasia 01/21/2019    Priority: Medium    Presence of subcutaneous contraceptive implant 09/12/2018    Priority: Medium    Allergic rhinitis 04/01/2014  Priority: Low   Current Meds  Medication Sig   buPROPion (WELLBUTRIN XL) 300 MG 24 hr tablet TAKE 1 TABLET BY MOUTH EVERY DAY    Allergies: Patient has no known allergies. Family History: Patient family history includes Asthma in her brother; Healthy in her daughter, mother, son, and son; Hyperlipidemia in her father. Social History:  Patient  reports that she has never smoked. She has never used smokeless tobacco. She reports that she does not drink alcohol and  does not use drugs.  Review of Systems: Constitutional: Negative for fever malaise or anorexia Cardiovascular: negative for chest pain Respiratory: negative for SOB or persistent cough Gastrointestinal: negative for abdominal pain  Objective  Vitals: BP 112/80   Pulse (!) 102   Ht 5\' 2"  (1.575 m)   Wt 162 lb 12.8 oz (73.8 kg)   SpO2 96%   BMI 29.78 kg/m  General: no acute distress , A&Ox3 Right ankle: mild swelling over proximal medial foot/ankle Tender over navicular and talus, no medial malleolar ttp, pain with inversion and eversion. Won't weight bear  Commons side effects, risks, benefits, and alternatives for medications and treatment plan prescribed today were discussed, and the patient expressed understanding of the given instructions. Patient is instructed to call or message via MyChart if he/she has any questions or concerns regarding our treatment plan. No barriers to understanding were identified. We discussed Red Flag symptoms and signs in detail. Patient expressed understanding regarding what to do in case of urgent or emergency type symptoms.  Medication list was reconciled, printed and provided to the patient in AVS. Patient instructions and summary information was reviewed with the patient as documented in the AVS. This note was prepared with assistance of Dragon voice recognition software. Occasional wrong-word or sound-a-like substitutions may have occurred due to the inherent limitations of voice recognition software

## 2024-01-02 ENCOUNTER — Ambulatory Visit (INDEPENDENT_AMBULATORY_CARE_PROVIDER_SITE_OTHER): Payer: Commercial Managed Care - HMO | Admitting: Family Medicine

## 2024-01-02 ENCOUNTER — Other Ambulatory Visit: Payer: Self-pay

## 2024-01-02 ENCOUNTER — Encounter: Payer: Self-pay | Admitting: Family Medicine

## 2024-01-02 VITALS — BP 110/80 | HR 88 | Ht 62.0 in

## 2024-01-02 DIAGNOSIS — M25571 Pain in right ankle and joints of right foot: Secondary | ICD-10-CM

## 2024-01-02 MED ORDER — MELOXICAM 15 MG PO TABS: ORAL_TABLET | ORAL | 3 refills | Status: DC

## 2024-01-02 NOTE — Progress Notes (Signed)
I, Stevenson Clinch, CMA acting as a scribe for [name], MD.  Carol May is a 38 y.o. female who presents to [facility] at [facility] today for R ankle pain. Pt was previously seen by Dr. Denyse Amass on 10/18/2023 for bilateral knee pain.  Today, patient c/o right ankle pain ongoing since Sunday.  She reports rolling her ankle in her driveway where it was uneven, slipping, forcing her ankle into E version.  She notes a hx of ORIF tib/fib in 2017. Pt locates pain to medial aspect of the ankle. Ambulating with walking boot and crutch today. Some swelling present. RICE. Sx causing night disturbance.   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 She has a relevant medical history for significant right ankle fracture in 2017 requiring surgery.   Exam:  BP 110/80   Pulse 88   Ht 5\' 2"  (1.575 m)   LMP 12/27/2023   SpO2 100%   BMI 29.78 kg/m  General: Well Developed, well nourished, and in no acute distress.   MSK: Right ankle joint effusion present with mature scars. Tender palpation along the medial aspect of the ankle.  Decreased range of motion.  Pulses cap refill and sensation are intact distally.    Lab and Radiology Results No results found for this or any previous visit (from the past 72 hours). DG Ankle Complete Right Result Date: 01/01/2024 CLINICAL DATA:  Fall medial ankle pain.  History of ankle surgery. EXAM: RIGHT ANKLE - COMPLETE 3+ VIEW COMPARISON:  Right ankle radiographs 07/24/2016, CT right ankle 07/27/2016 FINDINGS: Interval medial malleolar fixation with two longitudinal screws. Interval distal fibular lateral plate and screw fixation. Interval post malar plate and screw fixation. Improved alignment of the prior  trimalleolar fractures. The fracture lines have healed. The ankle mortise is symmetric and intact. No acute fracture dislocation. IMPRESSION: 1. Interval trimalleolar fracture fixation. Improved alignment of the prior fractures. The fracture lines have healed. 2. No acute fracture. Electronically Signed   By: Neita Garnet M.D.   On: 01/01/2024 20:29  I, [name], personally (independently) visualized and performed the interpretation of the images attached in this note.   Diagnostic Limited MSK Ultrasound of: Right medial ankle The head of the screw into the medial malleolus is visible.  Small joint effusion is present at the medial ankle.  Posterior tibialis tendon is intact with minimal swelling around the tendon sheath. Impression: Joint effusion and mild posterior tibialis tenosynovitis    Assessment and Plan: 38 y.o. female with significant left ankle pain occurring after an inversion injury a few days ago.  Unexpectedly her pain is located at the medial ankle with an inversion injury.  It is possible she had an impingement issue causing her medial pain.  Plan to continue current CAM Walker boot and single crutch as needed.  She can discontinue the crutch as soon as she feels able to.  She might find a knee scooter to be helpful.  Plan to work on range of motion exercises now and starting strengthening exercises with bands when able.  Okay to return to work.  Prescribed meloxicam.  Recheck in 2 weeks.   PDMP not reviewed this encounter. Orders Placed This Encounter  Procedures   [place] LIMITED JOINT SPACE STRUCTURES  LOW RIGHT(NO LINKED CHARGES)    Reason for Exam (SYMPTOM  OR DIAGNOSIS REQUIRED):   right ankle pai / injury    Preferred imaging location?:   [facility] ordered this encounter  Medications   meloxicam (MOBIC) 15 MG tablet    Sig: One tab PO qAM with breakfast for 2 weeks, then daily prn pain.    Dispense:  30 tablet    Refill:  3      Discussed warning signs or symptoms. Please see discharge instructions. Patient expresses understanding.   The above documentation has been reviewed and is accurate and complete [name], M.D.

## 2024-01-02 NOTE — Progress Notes (Signed)
See mychart note Dear Ms. Carol May, Good news, no new fractures of your ankle. I recommend wearing the boot for the next several days and if not improving, then see Dr. Denyse Amass for further evaluation.  Sincerely, Dr. Mardelle Matte

## 2024-01-02 NOTE — Patient Instructions (Addendum)
Thank you for coming in today.   Use the boot and use the single crutch or a knee scooter as needed.   Anticipate weaning to a ankle compression sleeve when able.   I recommend you obtained a compression sleeve to help with your joint problems. There are many options on the market however I recommend obtaining a full ankle Body Helix compression sleeve.  You can find information (including how to appropriate measure yourself for sizing) can be found at www.[email or web address].ch.  Many of these products are health savings account (HSA) eligible.   You can use the compression sleeve at any time throughout the day but is most important to use while being active as well as for 2 hours post-activity.   It is appropriate to ice following activity with the compression sleeve in place.   Work on range of motion exercises.  When able ok to restart band exercises.   Recheck in 2 weeks.   Use meloxicam as needed.   Driving. Take the boot off while driving.

## 2024-01-14 ENCOUNTER — Encounter: Payer: Self-pay | Admitting: Family Medicine

## 2024-01-15 NOTE — Telephone Encounter (Signed)
 Forwarding to Dr. Alease Hunter as Arlie Lain.   Vernie Goodness, and [place],  Will you please reach out to pt to help assist with scheduling an appointment for eval this week.   Thank you!

## 2024-01-15 NOTE — Telephone Encounter (Signed)
 Called and spoke to patient. She asked if Dr Alease Hunter would be able to go ahead and order an MRI for her (they discussed this at her visit)? She also asked if there was something else she could take for pain?  Please advise.

## 2024-01-15 NOTE — Telephone Encounter (Signed)
 Pt scheduled for 01/24/24

## 2024-01-17 ENCOUNTER — Encounter: Payer: Self-pay | Admitting: Family Medicine

## 2024-01-17 ENCOUNTER — Other Ambulatory Visit: Payer: Self-pay

## 2024-01-17 ENCOUNTER — Ambulatory Visit (INDEPENDENT_AMBULATORY_CARE_PROVIDER_SITE_OTHER): Payer: Commercial Managed Care - HMO | Admitting: Family Medicine

## 2024-01-17 VITALS — BP 112/80 | HR 96 | Ht 62.0 in | Wt 159.0 lb

## 2024-01-17 DIAGNOSIS — G8929 Other chronic pain: Secondary | ICD-10-CM | POA: Diagnosis not present

## 2024-01-17 DIAGNOSIS — M25571 Pain in right ankle and joints of right foot: Secondary | ICD-10-CM

## 2024-01-17 MED ORDER — PREDNISONE 50 MG PO TABS: 50.0000 mg | ORAL_TABLET | Freq: Every day | ORAL | 0 refills | Status: DC

## 2024-01-17 NOTE — Patient Instructions (Addendum)
Thank you for coming in today.   If not better next step could be injection.   OK to get a second opinion from Dr Victorino Dike.   We could do a nerve study.   I am not sure a MRI will be helpful.

## 2024-01-17 NOTE — Progress Notes (Signed)
I, Stevenson Clinch, CMA acting as a scribe for [name], MD.  Carol May is a 38 y.o. female who presents to [facility] at [facility] today for f/u R ankle pain. Pt was last seen by Dr. Denyse Amass on 01/02/24 and was advised to cont CAM walker boot and crutch prn, prescribed meloxicam, and advised to work AROM.   Today, pt reports worsening right ankle pain, sharp in nature. The ankle feels very fatigued while walking. Taking Meloxicam daily. Notes constant sharp pain. Swelling and bruising have improved. Tends to walk on the lateral aspect of the foot once the ankle starts to fatigue. Doing warm compress prn.   Dx imaging: 01/01/24 R ankle XR  Pertinent review of systems: No fevers or chills  Relevant historical information: History of ankle fracture.   Exam:  BP 112/80   Pulse 96   Ht 5\' 2"  (1.575 m)   Wt 159 lb (72.1 kg)   LMP 12/27/2023   SpO2 100%   BMI 29.08 kg/m  General: Well Developed, well nourished, and in no acute distress.   MSK: Right ankle mature scar medial ankle.  Tender palpation along the medial ankle along tarsal tunnel.  Pressure at tarsal tunnel does tend to exacerbate pain into the sole of the foot at mid arch.  She does have some paresthesia in this area.  Range of motion is reduced but strength is intact without reproduction of pain.    Lab and Radiology Results  Diagnostic Limited MSK Ultrasound of: Right medial ankle Posterior tibialis tendon is visualized and intact without tear.  Minimal hypoechoic fluid surrounds the tendon just distal to the tarsal tunnel. Direct pressure of the ultrasound probe at the posterior tibialis nerve at tarsal tunnel does reproduce pain and some paresthesia. Impression: Possible tarsal tunnel syndrome   EXAM: RIGHT ANKLE - COMPLETE 3+ VIEW   COMPARISON:  Right ankle radiographs 07/24/2016, CT right ankle 07/27/2016   FINDINGS: Interval medial malleolar fixation with two longitudinal  screws. Interval distal fibular lateral plate and screw fixation. Interval post malar plate and screw fixation. Improved alignment of the prior trimalleolar fractures. The fracture lines have healed. The ankle mortise is symmetric and intact. No acute fracture dislocation.   IMPRESSION: 1. Interval trimalleolar fracture fixation. Improved alignment of the prior fractures. The fracture lines have healed. 2. No acute fracture.     Electronically Signed   By: Neita Garnet M.D.   On: 01/01/2024 20:29   I, [name], personally (independently) visualized and performed the interpretation of the images attached in this note.   Assessment and Plan: 37 y.o. female with right medial ankle and foot pain after an injury.  This occurs in the setting of prior surgery.  Symptoms most consistent with some irritation of the posterior tibialis nerve.  She is improving a bit so a bit of watchful waiting is reasonable.  Will try using a short course of prednisone.  If needed next step could be injection of the posterior tibialis nerve and/or nerve conduction study.  Reasonable to also have a second opinion with an orthopedic surgeon.  Dr. Victorino Dike did her surgery about 10 years ago and he would be a good person to double check with if needed.   PDMP not reviewed this encounter. Orders Placed This Encounter  Procedures   [place] LIMITED JOINT SPACE STRUCTURES LOW RIGHT(NO LINKED CHARGES)    Reason for Exam (SYMPTOM  OR DIAGNOSIS REQUIRED):   right ankle pain    Preferred  imaging location?:   [facility] ordered this encounter  Medications   predniSONE (DELTASONE) 50 MG tablet    Sig: Take 1 tablet (50 mg total) by mouth daily.    Dispense:  5 tablet    Refill:  0     Discussed warning signs or symptoms. Please see discharge instructions. Patient expresses understanding.   The above documentation has been reviewed and is accurate and complete [name], M.D.

## 2024-01-24 ENCOUNTER — Ambulatory Visit: Payer: Commercial Managed Care - HMO | Admitting: Family Medicine

## 2024-04-03 NOTE — Telephone Encounter (Signed)
 Waiting till patient calls and states she would like this injecction again

## 2024-04-24 ENCOUNTER — Encounter: Payer: Commercial Managed Care - HMO | Admitting: Family Medicine

## 2024-05-09 ENCOUNTER — Ambulatory Visit (INDEPENDENT_AMBULATORY_CARE_PROVIDER_SITE_OTHER): Admitting: Family Medicine

## 2024-05-09 ENCOUNTER — Encounter: Payer: Self-pay | Admitting: Family Medicine

## 2024-05-09 VITALS — BP 107/65 | HR 69 | Temp 98.1°F | Ht 62.0 in | Wt 166.4 lb

## 2024-05-09 DIAGNOSIS — Z3009 Encounter for other general counseling and advice on contraception: Secondary | ICD-10-CM | POA: Diagnosis not present

## 2024-05-09 DIAGNOSIS — F4323 Adjustment disorder with mixed anxiety and depressed mood: Secondary | ICD-10-CM

## 2024-05-09 DIAGNOSIS — M2242 Chondromalacia patellae, left knee: Secondary | ICD-10-CM

## 2024-05-09 DIAGNOSIS — F321 Major depressive disorder, single episode, moderate: Secondary | ICD-10-CM

## 2024-05-09 DIAGNOSIS — L748 Other eccrine sweat disorders: Secondary | ICD-10-CM

## 2024-05-09 DIAGNOSIS — Z0000 Encounter for general adult medical examination without abnormal findings: Secondary | ICD-10-CM | POA: Diagnosis not present

## 2024-05-09 DIAGNOSIS — Z975 Presence of (intrauterine) contraceptive device: Secondary | ICD-10-CM | POA: Diagnosis not present

## 2024-05-09 LAB — COMPREHENSIVE METABOLIC PANEL WITH GFR
ALT: 14 U/L (ref 0–35)
AST: 20 U/L (ref 0–37)
Albumin: 4 g/dL (ref 3.5–5.2)
Alkaline Phosphatase: 43 U/L (ref 39–117)
BUN: 12 mg/dL (ref 6–23)
CO2: 23 meq/L (ref 19–32)
Calcium: 9.2 mg/dL (ref 8.4–10.5)
Chloride: 107 meq/L (ref 96–112)
Creatinine, Ser: 0.62 mg/dL (ref 0.40–1.20)
GFR: 115.08 mL/min
Glucose, Bld: 84 mg/dL (ref 70–99)
Potassium: 3.9 meq/L (ref 3.5–5.1)
Sodium: 139 meq/L (ref 135–145)
Total Bilirubin: 0.5 mg/dL (ref 0.2–1.2)
Total Protein: 6.9 g/dL (ref 6.0–8.3)

## 2024-05-09 LAB — TSH: TSH: 1.3 u[IU]/mL (ref 0.35–5.50)

## 2024-05-09 LAB — CBC WITH DIFFERENTIAL/PLATELET
Basophils Absolute: 0 10*3/uL (ref 0.0–0.1)
Basophils Relative: 0.4 % (ref 0.0–3.0)
Eosinophils Absolute: 0.1 10*3/uL (ref 0.0–0.7)
Eosinophils Relative: 2.5 % (ref 0.0–5.0)
HCT: 36.1 % (ref 36.0–46.0)
Hemoglobin: 12.2 g/dL (ref 12.0–15.0)
Lymphocytes Relative: 22.2 % (ref 12.0–46.0)
Lymphs Abs: 1.2 10*3/uL (ref 0.7–4.0)
MCHC: 33.7 g/dL (ref 30.0–36.0)
MCV: 85.3 fl (ref 78.0–100.0)
Monocytes Absolute: 0.4 10*3/uL (ref 0.1–1.0)
Monocytes Relative: 6.4 % (ref 3.0–12.0)
Neutro Abs: 3.7 10*3/uL (ref 1.4–7.7)
Neutrophils Relative %: 68.5 % (ref 43.0–77.0)
Platelets: 248 10*3/uL (ref 150.0–400.0)
RBC: 4.23 Mil/uL (ref 3.87–5.11)
RDW: 12.6 % (ref 11.5–15.5)
WBC: 5.5 10*3/uL (ref 4.0–10.5)

## 2024-05-09 LAB — LIPID PANEL
Cholesterol: 225 mg/dL — ABNORMAL HIGH (ref 0–200)
HDL: 72.1 mg/dL (ref >39.00)
LDL Cholesterol: 141 mg/dL — ABNORMAL HIGH (ref 0–99)
NonHDL: 153.37
Total CHOL/HDL Ratio: 3
Triglycerides: 62 mg/dL (ref 0.0–149.0)
VLDL: 12.4 mg/dL (ref 0.0–40.0)

## 2024-05-09 MED ORDER — CLINDAMYCIN PHOS (ONCE-DAILY) 1 % EX GEL: 1.0000 | Freq: Every day | CUTANEOUS | 0 refills | Status: AC

## 2024-05-09 NOTE — Progress Notes (Signed)
 Subjective  Chief Complaint  Patient presents with   Annual Exam    Pt here for Annual Exam and is currently fasting. Pap is scheduled for August    HPI: Carol May is a 38 y.o. female who presents to [facility] [place] today for a Female Wellness Visit.  She also has the concerns and/or needs as listed above in the chief complaint. These will be addressed in addition to the Health Maintenance Visit.   Wellness Visit: annual visit with health maintenance review and exam HM: sees Dr. Donelda Fujita for female wellness. Had 4th nexplanon placed in march for [facility]. Last Pap up to date but she is unaware of the date. Working as Hospital doctor and likes it. Mother of 3 children. All doing well. Imms current Chronic disease management visit and/or acute problem visit: Mood d/o: very stable and well controlled on wellbutrin .  C/o bad axillary odor w/o hyperhidrosis.   Assessment  1. Annual physical exam   2. Adjustment disorder with mixed anxiety and depressed mood   3. Presence of subcutaneous contraceptive implant   4. Birth control counseling   5. Depression, major, single episode, moderate (HCC)   6. Patellofemoral chondrosis of left knee   7. Axillary odor      Plan  Female Wellness Visit: Age appropriate Health Maintenance and Prevention measures were discussed with patient. Included topics are cancer screening recommendations, ways to keep healthy (see AVS) including dietary and exercise recommendations, regular eye and dental care, use of seat belts, and avoidance of moderate alcohol use and tobacco use. Will request records for last pap.  BMI: discussed patient's BMI and encouraged positive lifestyle modifications to help get to or maintain a target BMI. HM needs and immunizations were addressed and ordered. See below for orders. See HM and immunization section for updates. Routine labs and screening tests ordered including cmp, cbc and lipids where  appropriate. Discussed recommendations regarding Vit D and calcium supplementation (see AVS)  Chronic disease f/u and/or acute problem visit: (deemed necessary to be done in addition to the wellness visit): Adjustment anxiety:  continue wellbutrin . Well controlle.d  Reviewed pmh: knee pain is improved.  Discuss long term birth control options. Pt to consider IUD in future.  Axillary odor: trial of topical clindamycin offered.   Follow up: 12 mo for cpe  Orders Placed This Encounter  Procedures   CBC with Differential/Platelet   Comprehensive metabolic panel with GFR   Lipid panel   TSH   Meds ordered this encounter  Medications   Clindamycin Phos, Once-Daily, (CLINDAGEL) 1 % GEL    Sig: Apply 1 application  topically daily. To underarms for 10 days    Dispense:  75 mL    Refill:  0       Body mass index is 30.43 kg/m. Wt Readings from Last 3 Encounters:  05/09/24 166 lb 6.4 oz (75.5 kg)  01/17/24 159 lb (72.1 kg)  01/01/24 162 lb 12.8 oz (73.8 kg)     Patient Active Problem List   Diagnosis Date Noted Date Diagnosed   Depression, major, single episode, moderate (HCC) 09/15/2021     Priority: High   Adjustment disorder with mixed anxiety and depressed mood 03/10/2021     Priority: High    Stable on wellbutrin ; failed lexapro  10 (nausea), 2022    Patellofemoral chondrosis of left knee 03/22/2023     Priority: Medium    Bilateral temporomandibular joint pain 03/30/2022     Priority: Medium  Patellofemoral chondrosis of right knee 03/31/2021     Priority: Medium    Cervical dysplasia 01/21/2019     Priority: Medium     CIN 1-2; s/p LEEP 01/2019 Dr. Sharol Decamp    Presence of subcutaneous contraceptive implant 09/12/2018     Priority: Medium     March 2022; nexplanon #3, Dr. Donelda Fujita    Allergic rhinitis 04/01/2014     Priority: Low   Health Maintenance  Topic Date Due   Cervical Cancer Screening (HPV/Pap Cotest)  10/25/2023   INFLUENZA VACCINE   07/05/2024   DTaP/Tdap/Td (4 - Td or Tdap) 11/14/2024   HPV VACCINES  Completed   Hepatitis C Screening  Completed   HIV Screening  Completed   Meningococcal B Vaccine  Aged Out   COVID-19 Vaccine  Discontinued   Immunization History  Administered Date(s) Administered   HPV 9-valent 04/09/2015, 06/09/2015, 04/19/2023   Hepatitis B, PED/ADOLESCENT 08/29/2000, 10/02/2000, 02/21/2001   Influenza Inj Mdck Quad Pf 09/15/2016   Influenza, Seasonal, Injecte, Preservative Fre 09/22/2016, 08/16/2023   Influenza,inj,Quad PF,6+ Mos 09/15/2021, 09/07/2022   Influenza-Unspecified 09/15/2016, 09/12/2018, 11/05/2020   MMR 07/28/1994, 04/11/2003, 10/12/2022   PPD Test 02/27/2017   Tdap 04/22/2012, 09/13/2014, 11/14/2014   Varicella 04/11/2003   We updated and reviewed the patient's past history in detail and it is documented below. Allergies: Patient  reports no history of alcohol use. Past Medical History Patient  has a past medical history of AR (allergic rhinitis), Cervical dysplasia (01/21/2019), and H/O varicella. Past Surgical History Patient  has a past surgical history that includes Open reduction internal fixation (orif) tibia/fibula fracture (Right, 08/11/2016) and Colposcopy (12/19/2018). Social History   Socioeconomic History   Marital status: Single    Spouse name: lives with boyfriend   Number of children: 3   Years of education: Not on file   Highest education level: Not on file  Occupational History   Occupation: dental hygeinist    Employer: [employer]  Tobacco Use   Smoking status: Never   Smokeless tobacco: Never  Vaping Use   Vaping status: Never Used  Substance and Sexual Activity   Alcohol use: No   Drug use: No   Sexual activity: Yes    Birth control/protection: Implant  Other Topics Concern   Not on file  Social History Narrative   Not on file   Social Drivers of Health   Financial Resource Strain: Not on file  Food Insecurity: Not on file   Transportation Needs: Not on file  Physical Activity: Not on file  Stress: Not on file  Social Connections: Unknown (04/13/2022)   Received from [employer]   Social Network    Social Network: Not on file   Family History  Problem Relation Age of Onset   Hyperlipidemia Father    Asthma Brother    Healthy Daughter    Healthy Son    Healthy Son    Healthy Mother     Review of Systems: Constitutional: negative for fever or malaise Ophthalmic: negative for photophobia, double vision or loss of vision Cardiovascular: negative for chest pain, dyspnea on exertion, or new LE swelling Respiratory: negative for SOB or persistent cough Gastrointestinal: negative for abdominal pain, change in bowel habits or melena Genitourinary: negative for dysuria or gross hematuria, no abnormal uterine bleeding or disharge Musculoskeletal: negative for new gait disturbance or muscular weakness Integumentary: negative for new or persistent rashes, no breast lumps Neurological: negative for TIA or stroke symptoms Psychiatric: negative for  SI or delusions Allergic/Immunologic: negative for hives  Patient Care Team    Relationship Specialty Notifications Start End  Luevenia Saha, MD PCP - General Family Medicine  08/20/18   Margaretmary Shaver, MD Consulting Physician Obstetrics and Gynecology  03/10/21   Syliva Even, MD Consulting Physician Family Medicine  02/02/22     Objective  Vitals: BP 107/65   Pulse 69   Temp 98.1 F (36.7 C)   Ht 5\' 2"  (1.575 m)   Wt 166 lb 6.4 oz (75.5 kg)   SpO2 97%   BMI 30.43 kg/m  General:  Well developed, well nourished, no acute distress  Psych:  Alert and orientedx3,normal mood and affect HEENT:  Normocephalic, atraumatic, non-icteric sclera, PERRL, supple neck without adenopathy, mass or thyromegaly Cardiovascular:  Normal S1, S2, RRR without gallop, rub or murmur Respiratory:  Good breath sounds bilaterally, CTAB with normal respiratory  effort Gastrointestinal: normal bowel sounds, soft, non-tender, no noted masses. No HSM MSK: no deformities, contusions. Joints are without erythema or swelling.  Skin:  Warm, no rashes or suspicious lesions noted Neurologic:    Mental status is normal. Gross motor and sensory exams are normal. Normal gait. No tremor    Commons side effects, risks, benefits, and alternatives for medications and treatment plan prescribed today were discussed, and the patient expressed understanding of the given instructions. Patient is instructed to call or message via MyChart if he/she has any questions or concerns regarding our treatment plan. No barriers to understanding were identified. We discussed Red Flag symptoms and signs in detail. Patient expressed understanding regarding what to do in case of urgent or emergency type symptoms.  Medication list was reconciled, printed and provided to the patient in AVS. Patient instructions and summary information was reviewed with the patient as documented in the AVS. This note was prepared with assistance of Dragon voice recognition software. Occasional wrong-word or sound-a-like substitutions may have occurred due to the inherent limitations of voice recognition software .

## 2024-05-09 NOTE — Patient Instructions (Addendum)
 Please return in 12 months for your annual complete physical; please come fasting.   I will release your lab results to you on your MyChart account with further instructions. You may see the results before I do, but when I review them I will send you a message with my report or have my assistant call you if things need to be discussed. Please reply to my message with any questions. Thank you!   If you have any questions or concerns, please don't hesitate to send me a message via MyChart or call the office at [phone]. Thank you for visiting with us  today! It's our pleasure caring for you.    VISIT SUMMARY: During today's visit, we discussed your concerns about increased armpit odor, discomfort from your Nexplanon implant, and persistent ankle pain. We also reviewed your general health maintenance needs.  YOUR PLAN: -NEXPLANON-RELATED DISCOMFORT: You are experiencing discomfort from your Nexplanon implant, which is different from your previous experiences. Nexplanon is a type of birth control implant. We discussed the possibility of switching to an IUD as an alternative long-term contraceptive option. We may also refer you to a specialist to discuss further contraceptive options.  -UNDERARM ODOR: this can be due to a bacterial overgrowth in the area. I have sent in an antibiotic to use daily for 10 days to see if this will help. Let me know.   -ANKLE PAIN WITH HARDWARE: You have persistent ankle pain due to hardware from a previous surgery. The orthopedic surgeon has recommended the removal of screws, especially on the side where they are protruding. Please follow up with your orthopedic surgeon to discuss the potential removal of the screws.  -GENERAL HEALTH MAINTENANCE: You are due for a Pap smear following your LEEP procedure in 2020. Your current medication, Wellbutrin , is well-tolerated, and you have reported a good mood. Please confirm the date of your last Pap smear and schedule one if it  is due. Continue taking Wellbutrin  as prescribed.  INSTRUCTIONS: Please follow up with your orthopedic surgeon to discuss the potential removal of the screws in your ankle. Confirm the date of your last Pap smear and schedule one if it is due. Continue taking Wellbutrin  as prescribed.                      Contains text generated by Abridge.                                 Contains text generated by Abridge.

## 2024-05-12 ENCOUNTER — Ambulatory Visit: Payer: Self-pay | Admitting: Family Medicine

## 2024-05-12 NOTE — Progress Notes (Signed)
Labs reviewed.  The ASCVD Risk score (Arnett DK, et al., 2019) failed to calculate for the following reasons:   The 2019 ASCVD risk score is only valid for ages 40 to 79 

## 2024-08-30 ENCOUNTER — Encounter (HOSPITAL_COMMUNITY): Payer: Self-pay | Admitting: *Deleted

## 2024-08-31 NOTE — H&P (Signed)
 Carol May is an 38 y.o. female [phone] with RPOC after TAB.  AUB, US  revealed RPOC.  Trial of cytotec  - continued retained products of conception.  D/W pt r/b/a including but not limited to bleeding, infections and damage to surrounding organs, also d/w pt process and expectations - will proceed.    Pertinent Gynecological History: Pap 08/29/24 WNL HR HPV neg, h/o LEEP 2/20 US  reveals RPOC  Irr VB No STI [id number] - SVD x 3, Tab - RPOC Menstrual History:  No LMP recorded. Patient has had an implant.    Past Medical History:  Diagnosis Date   Anxiety    AR (allergic rhinitis)    Cervical dysplasia 01/21/2019   CIN 1-2; s/p LEEP 01/2019 Dr. Danielle   Depression    H/O varicella     Past Surgical History:  Procedure Laterality Date   COLPOSCOPY  12/19/2018   OPEN REDUCTION INTERNAL FIXATION (ORIF) TIBIA/FIBULA FRACTURE Right 08/11/2016   Procedure: OPEN REDUCTION INTERNAL FIXATION (ORIF) RIGHT TIBIA/FIBULA  PILON FRACTURE;  Surgeon: Norleen Armor, MD;  Location: [facility];  Service: Orthopedics;  Laterality: Right;    Family History  Problem Relation Age of Onset   Hyperlipidemia Father    Asthma Brother    Healthy Daughter    Healthy Son    Healthy Son    Healthy Mother     Social History:  reports that she has never smoked. She has never used smokeless tobacco. She reports that she does not drink alcohol and does not use drugs. Dental assistant - oral surgery, single  Allergies: No Known Allergies  Meds: Nexplanon, promethazine  (oxycodone , ibuprofen )  Review of Systems  Constitutional: Negative.   HENT: Negative.    Respiratory: Negative.    Gastrointestinal: Negative.   Endocrine: Negative.   Genitourinary:  Positive for menstrual problem.  Musculoskeletal: Negative.   Skin: Negative.   Neurological: Negative.   Psychiatric/Behavioral: Negative.      There were no vitals taken for this visit. Physical Exam Constitutional:       Appearance: Normal appearance.  HENT:     Head: Normocephalic and atraumatic.  Cardiovascular:     Rate and Rhythm: Normal rate and regular rhythm.  Pulmonary:     Effort: Pulmonary effort is normal.     Breath sounds: Normal breath sounds.  Abdominal:     General: Bowel sounds are normal.     Palpations: Abdomen is soft.  Genitourinary:    General: Normal vulva.     Rectum: Normal.  Musculoskeletal:        General: Normal range of motion.     Cervical back: Normal range of motion and neck supple.  Skin:    General: Skin is warm and dry.  Neurological:     General: No focal deficit present.     Mental Status: She is alert and oriented to person, place, and time.  Psychiatric:        Mood and Affect: Mood normal.        Behavior: Behavior normal.   US  RPOD at fundus  Assessment/Plan: 38yo [id number] with RPOC  for hysteroscopy, D&C due to failed cytotec Pt has post op meds (oxycodone  and ibuprofen ) Also has post op appt D/w pt r/b/a also process and expectations  Daelan Gatt Bovard-Stuckert 08/31/2024, 4:46 PM

## 2024-09-04 ENCOUNTER — Encounter (HOSPITAL_COMMUNITY): Payer: Self-pay

## 2024-09-04 NOTE — Progress Notes (Signed)
 Spoke w/ via phone for pre-op interview--- Unable to reach, message with instructions left on VM Lab needs dos----  NONE       Lab results------ COVID test -----patient states asymptomatic no test needed Arrive at -------0600 NPO after MN NO Solid Food.   Pre-Surgery Ensure or G2:  Med rec completed Medications to take morning of surgery ----- Wellbutrin  Diabetic medication -----  GLP1 agonist last dose: GLP1 instructions:  Patient instructed no nail polish to be worn day of surgery Patient instructed to bring photo id and insurance card day of surgery Patient aware to have Driver (ride ) / caregiver    for 24 hours after surgery - Unknown left on message she needs driver and caregiver for 24 hrs. Patient Special Instructions ----- Pre-Op special Instructions -----  Patient verbalized understanding of instructions that were given at this phone interview. Patient denies chest pain, sob, fever, cough at the interview.

## 2024-09-04 NOTE — Anesthesia Preprocedure Evaluation (Signed)
 Anesthesia Evaluation  Patient identified by MRN, date of birth, ID band Patient awake    Reviewed: Allergy & Precautions, NPO status , Patient's Chart, lab work & pertinent test results  History of Anesthesia Complications Negative for: history of anesthetic complications  Airway Mallampati: I  TM Distance: >3 FB Neck ROM: Full    Dental  (+) Dental Advisory Given   Pulmonary neg pulmonary ROS   breath sounds clear to auscultation       Cardiovascular negative cardio ROS  Rhythm:Regular Rate:Normal     Neuro/Psych   Anxiety Depression    negative neurological ROS     GI/Hepatic negative GI ROS, Neg liver ROS,,,  Endo/Other  negative endocrine ROS    Renal/GU negative Renal ROS     Musculoskeletal  (+) Arthritis ,    Abdominal   Peds  Hematology negative hematology ROS (+)   Anesthesia Other Findings   Reproductive/Obstetrics Incomplete abortion                              Anesthesia Physical Anesthesia Plan  ASA: 2  Anesthesia Plan: General   Post-op Pain Management: Tylenol  PO (pre-op)* and Minimal or no pain anticipated   Induction: Intravenous  PONV Risk Score and Plan: 3 and Ondansetron , Dexamethasone  and Scopolamine  patch - Pre-op  Airway Management Planned: LMA  Additional Equipment: None  Intra-op Plan:   Post-operative Plan:   Informed Consent: I have reviewed the patients History and Physical, chart, labs and discussed the procedure including the risks, benefits and alternatives for the proposed anesthesia with the patient or authorized representative who has indicated his/her understanding and acceptance.     Dental advisory given  Plan Discussed with: CRNA and Surgeon  Anesthesia Plan Comments:          Anesthesia Quick Evaluation

## 2024-09-04 NOTE — Progress Notes (Signed)
 Called and spoke with surgery scheduler Chiquita about another contact number for pt. Chiquita texted pt with instructions to call me for preop call. Have not heard from pt yet.

## 2024-09-05 ENCOUNTER — Ambulatory Visit (HOSPITAL_COMMUNITY): Payer: Self-pay | Admitting: Anesthesiology

## 2024-09-05 ENCOUNTER — Encounter (HOSPITAL_COMMUNITY)
Admission: RE | Disposition: A | Discharge status: Home / Self Care | Payer: Self-pay | Source: Home / Self Care | Attending: Obstetrics and Gynecology

## 2024-09-05 ENCOUNTER — Other Ambulatory Visit: Payer: Self-pay

## 2024-09-05 ENCOUNTER — Encounter (HOSPITAL_COMMUNITY): Payer: Self-pay | Admitting: Obstetrics and Gynecology

## 2024-09-05 ENCOUNTER — Ambulatory Visit (HOSPITAL_COMMUNITY)
Admission: RE | Admit: 2024-09-05 | Discharge: 2024-09-05 | Disposition: A | Discharge status: Home / Self Care | Attending: Obstetrics and Gynecology | Admitting: Obstetrics and Gynecology

## 2024-09-05 DIAGNOSIS — F419 Anxiety disorder, unspecified: Secondary | ICD-10-CM | POA: Diagnosis not present

## 2024-09-05 DIAGNOSIS — O034 Incomplete spontaneous abortion without complication: Secondary | ICD-10-CM | POA: Insufficient documentation

## 2024-09-05 DIAGNOSIS — N939 Abnormal uterine and vaginal bleeding, unspecified: Secondary | ICD-10-CM | POA: Insufficient documentation

## 2024-09-05 DIAGNOSIS — Z3A00 Weeks of gestation of pregnancy not specified: Secondary | ICD-10-CM

## 2024-09-05 DIAGNOSIS — F32A Depression, unspecified: Secondary | ICD-10-CM | POA: Diagnosis not present

## 2024-09-05 DIAGNOSIS — O074 Failed attempted termination of pregnancy without complication: Secondary | ICD-10-CM

## 2024-09-05 HISTORY — PX: HYSTEROSCOPY WITH D & C: SHX1775

## 2024-09-05 HISTORY — DX: Anxiety disorder, unspecified: F41.9

## 2024-09-05 HISTORY — DX: Depression, unspecified: F32.A

## 2024-09-05 HISTORY — PX: MYOSURE RESECTION: SHX7611

## 2024-09-05 LAB — CBC
HCT: 42.1 % (ref 36.0–46.0)
Hemoglobin: 13.9 g/dL (ref 12.0–15.0)
MCH: 27.8 pg (ref 26.0–34.0)
MCHC: 33 g/dL (ref 30.0–36.0)
MCV: 84.2 fL (ref 80.0–100.0)
Platelets: 236 K/uL (ref 150–400)
RBC: 5 MIL/uL (ref 3.87–5.11)
RDW: 15 % (ref 11.5–15.5)
WBC: 7 K/uL (ref 4.0–10.5)
nRBC: 0 % (ref 0.0–0.2)

## 2024-09-05 LAB — POCT PREGNANCY, URINE: Preg Test, Ur: NEGATIVE

## 2024-09-05 SURGERY — DILATATION AND CURETTAGE /HYSTEROSCOPY
Anesthesia: General

## 2024-09-05 MED ORDER — LACTATED RINGERS IV SOLN: INTRAVENOUS | Status: DC

## 2024-09-05 MED ORDER — OXYCODONE HCL 5 MG PO TABS: 5.0000 mg | ORAL_TABLET | Freq: Once | ORAL | Status: DC | PRN

## 2024-09-05 MED ORDER — PHENYLEPHRINE 80 MCG/ML (10ML) SYRINGE FOR IV PUSH (FOR BLOOD PRESSURE SUPPORT)
PREFILLED_SYRINGE | INTRAVENOUS | Status: AC
  Filled 2024-09-05: qty 10

## 2024-09-05 MED ORDER — KETOROLAC TROMETHAMINE 30 MG/ML IJ SOLN
INTRAMUSCULAR | Status: AC
  Filled 2024-09-05: qty 1

## 2024-09-05 MED ORDER — MIDAZOLAM HCL 2 MG/2ML IJ SOLN
INTRAMUSCULAR | Status: AC
  Filled 2024-09-05: qty 2

## 2024-09-05 MED ORDER — KETOROLAC TROMETHAMINE 30 MG/ML IJ SOLN
INTRAMUSCULAR | Status: DC | PRN
Start: 2024-09-05 — End: 2024-09-05
  Administered 2024-09-05: 30 mg via INTRAVENOUS

## 2024-09-05 MED ORDER — MEPERIDINE HCL 25 MG/ML IJ SOLN: 6.2500 mg | INTRAMUSCULAR | Status: DC | PRN

## 2024-09-05 MED ORDER — DEXAMETHASONE SODIUM PHOSPHATE 10 MG/ML IJ SOLN
INTRAMUSCULAR | Status: DC | PRN
  Administered 2024-09-05: 10 mg via INTRAVENOUS

## 2024-09-05 MED ORDER — MIDAZOLAM HCL 2 MG/2ML IJ SOLN: 0.5000 mg | Freq: Once | INTRAMUSCULAR | Status: DC | PRN

## 2024-09-05 MED ORDER — ACETAMINOPHEN 500 MG PO TABS
ORAL_TABLET | ORAL | Status: AC
  Filled 2024-09-05: qty 2

## 2024-09-05 MED ORDER — LIDOCAINE HCL 1 % IJ SOLN
INTRAMUSCULAR | Status: DC | PRN
  Administered 2024-09-05: 10 mL

## 2024-09-05 MED ORDER — LIDOCAINE 2% (20 MG/ML) 5 ML SYRINGE
INTRAMUSCULAR | Status: AC
Start: 2024-09-05 — End: 2024-09-05
  Filled 2024-09-05: qty 5

## 2024-09-05 MED ORDER — FENTANYL CITRATE (PF) 250 MCG/5ML IJ SOLN
INTRAMUSCULAR | Status: AC
  Filled 2024-09-05: qty 5

## 2024-09-05 MED ORDER — ONDANSETRON HCL 4 MG/2ML IJ SOLN
INTRAMUSCULAR | Status: DC | PRN
  Administered 2024-09-05: 4 mg via INTRAVENOUS

## 2024-09-05 MED ORDER — CHLORHEXIDINE GLUCONATE 0.12 % MT SOLN
OROMUCOSAL | Status: AC
  Filled 2024-09-05: qty 15

## 2024-09-05 MED ORDER — PROPOFOL 10 MG/ML IV BOLUS
INTRAVENOUS | Status: DC | PRN
  Administered 2024-09-05: 200 mg via INTRAVENOUS
  Administered 2024-09-05: 175 ug/kg/min via INTRAVENOUS

## 2024-09-05 MED ORDER — LIDOCAINE HCL 1 % IJ SOLN
INTRAMUSCULAR | Status: AC
  Filled 2024-09-05: qty 60

## 2024-09-05 MED ORDER — ONDANSETRON HCL 4 MG/2ML IJ SOLN
INTRAMUSCULAR | Status: AC
  Filled 2024-09-05: qty 2

## 2024-09-05 MED ORDER — MIDAZOLAM HCL 2 MG/2ML IJ SOLN
INTRAMUSCULAR | Status: DC | PRN
Start: 2024-09-05 — End: 2024-09-05
  Administered 2024-09-05: 2 mg via INTRAVENOUS

## 2024-09-05 MED ORDER — SCOPOLAMINE 1 MG/3DAYS TD PT72
MEDICATED_PATCH | TRANSDERMAL | Status: AC
  Filled 2024-09-05: qty 1

## 2024-09-05 MED ORDER — LIDOCAINE 2% (20 MG/ML) 5 ML SYRINGE
INTRAMUSCULAR | Status: DC | PRN
  Administered 2024-09-05: 100 mg via INTRAVENOUS

## 2024-09-05 MED ORDER — OXYCODONE HCL 5 MG/5ML PO SOLN: 5.0000 mg | Freq: Once | ORAL | Status: DC | PRN

## 2024-09-05 MED ORDER — SODIUM CHLORIDE 0.9 % IR SOLN
Status: DC | PRN
  Administered 2024-09-05: 3000 mL

## 2024-09-05 MED ORDER — POVIDONE-IODINE 10 % EX SWAB: 2.0000 | Freq: Once | CUTANEOUS | Status: DC

## 2024-09-05 MED ORDER — ACETAMINOPHEN 500 MG PO TABS
1000.0000 mg | ORAL_TABLET | ORAL | Status: AC
  Administered 2024-09-05: 1000 mg via ORAL

## 2024-09-05 MED ORDER — FENTANYL CITRATE (PF) 250 MCG/5ML IJ SOLN
INTRAMUSCULAR | Status: DC | PRN
  Administered 2024-09-05: 50 ug via INTRAVENOUS
  Administered 2024-09-05: 100 ug via INTRAVENOUS

## 2024-09-05 MED ORDER — CHLORHEXIDINE GLUCONATE 0.12 % MT SOLN
15.0000 mL | Freq: Once | OROMUCOSAL | Status: AC
  Administered 2024-09-05: 15 mL via OROMUCOSAL

## 2024-09-05 MED ORDER — DEXAMETHASONE SODIUM PHOSPHATE 10 MG/ML IJ SOLN
INTRAMUSCULAR | Status: AC
  Filled 2024-09-05: qty 1

## 2024-09-05 MED ORDER — EPHEDRINE SULFATE-NACL 50-0.9 MG/10ML-% IV SOSY
PREFILLED_SYRINGE | INTRAVENOUS | Status: DC | PRN
Start: 2024-09-05 — End: 2024-09-05
  Administered 2024-09-05 (×3): 5 mg via INTRAVENOUS
  Administered 2024-09-05: 2.5 mg via INTRAVENOUS

## 2024-09-05 MED ORDER — SCOPOLAMINE 1 MG/3DAYS TD PT72
1.0000 | MEDICATED_PATCH | TRANSDERMAL | Status: DC
  Administered 2024-09-05: 1 mg via TRANSDERMAL

## 2024-09-05 MED ORDER — FENTANYL CITRATE (PF) 100 MCG/2ML IJ SOLN: 25.0000 ug | INTRAMUSCULAR | Status: DC | PRN

## 2024-09-05 MED ORDER — ORAL CARE MOUTH RINSE: 15.0000 mL | Freq: Once | OROMUCOSAL | Status: AC

## 2024-09-05 SURGICAL SUPPLY — 13 items
DEVICE MYOSURE LITE (MISCELLANEOUS) IMPLANT
GLOVE BIO SURGEON STRL SZ 6.5 (GLOVE) ×2 IMPLANT
GLOVE BIOGEL PI IND STRL 7.0 (GLOVE) ×2 IMPLANT
GLOVE SURG UNDER POLY LF SZ7 (GLOVE) ×2 IMPLANT
GOWN STRL REUS W/ TWL LRG LVL3 (GOWN DISPOSABLE) ×4 IMPLANT
KIT PROCED FLUENT PRO FLT212S (KITS) IMPLANT
KIT TURNOVER KIT B (KITS) ×2 IMPLANT
PACK VAGINAL MINOR WOMEN LF (CUSTOM PROCEDURE TRAY) ×2 IMPLANT
PAD OB MATERNITY 11 LF (PERSONAL CARE ITEMS) ×2 IMPLANT
SEAL ROD LENS SCOPE MYOSURE (ABLATOR) ×2 IMPLANT
SOL .9 NS 3000ML IRR UROMATIC (IV SOLUTION) IMPLANT
TOWEL GREEN STERILE FF (TOWEL DISPOSABLE) ×4 IMPLANT
UNDERPAD 30X36 HEAVY ABSORB (UNDERPADS AND DIAPERS) ×2 IMPLANT

## 2024-09-05 NOTE — Transfer of Care (Signed)
 Immediate Anesthesia Transfer of Care Note  Patient: Carol May  Procedure(s) Performed: DILATATION AND CURETTAGE LELDON NAIL RESECTION  Patient Location: PACU  Anesthesia Type:General  Level of Consciousness: awake, alert , oriented, and patient cooperative  Airway & Oxygen Therapy: Patient Spontanous Breathing  Post-op Assessment: Report given to RN, Post -op Vital signs reviewed and stable, Patient moving all extremities X 4, and Patient able to stick tongue midline  Post vital signs: Reviewed and stable  Last Vitals:  Vitals Value Taken Time  BP 106/72 09/05/24 08:01  Temp 36.6 C 09/05/24 08:00  Pulse 86 09/05/24 08:03  Resp 16 09/05/24 08:03  SpO2 94 % 09/05/24 08:03  Vitals shown include unfiled device data.  Last Pain:  Vitals:   09/05/24 9366  TempSrc: Oral  PainSc: 0-No pain      Patients Stated Pain Goal: 3 (09/05/24 9366)  Complications: No notable events documented.

## 2024-09-05 NOTE — Brief Op Note (Signed)
 09/05/2024  8:10 AM  PATIENT:  Carol May  38 y.o. female  PRE-OPERATIVE DIAGNOSIS:  retained products, AUB  POST-OPERATIVE DIAGNOSIS:  retained products, AUB  PROCEDURE:  Procedure(s): DILATATION AND CURETTAGE /HYSTEROSCOPY (N/A) MYOSURE RESECTION  SURGEON:  Surgeons and Role:    * Bovard-Stuckert, Daven Pinckney, MD - Primary  ANESTHESIA:   general by LMA, local anesthesia  EBL:  5 mL IVF and uop per anesthesia  DRAINS: none   LOCAL MEDICATIONS USED:  LIDOCAINE    SPECIMEN:  Source of Specimen:  endometrial curetting likely RPOC  DISPOSITION OF SPECIMEN:  PATHOLOGY  COUNTS:  YES  TOURNIQUET:  * No tourniquets in log *  DICTATION: .Other Dictation: Dictation Number [id number]  PLAN OF CARE: Discharge to home after PACU  PATIENT DISPOSITION:  PACU - hemodynamically stable.   Delay start of Pharmacological VTE agent (>24hrs) due to surgical blood loss or risk of bleeding: not applicable

## 2024-09-05 NOTE — Anesthesia Postprocedure Evaluation (Signed)
 Anesthesia Post Note  Patient: Carol May  Procedure(s) Performed: DILATATION AND CURETTAGE LELDON NAIL RESECTION     Patient location during evaluation: PACU Anesthesia Type: General Level of consciousness: awake and alert, patient cooperative and oriented Pain management: pain level controlled Vital Signs Assessment: post-procedure vital signs reviewed and stable Respiratory status: spontaneous breathing, nonlabored ventilation and respiratory function stable Cardiovascular status: blood pressure returned to baseline and stable Postop Assessment: no apparent nausea or vomiting, able to ambulate and adequate PO intake Anesthetic complications: no   No notable events documented.  Last Vitals:  Vitals:   09/05/24 0830 09/05/24 0845  BP: 106/67 103/71  Pulse: 63 65  Resp: 12 14  Temp:  36.5 C  SpO2: 98% 96%    Last Pain:  Vitals:   09/05/24 0845  TempSrc:   PainSc: 0-No pain                 Adalberto Metzgar,E. Eluzer [place]

## 2024-09-05 NOTE — Op Note (Signed)
 Carol May, [place] MEDICAL RECORD NO: [id number] ACCOUNT NO: [id number] DATE OF BIRTH: 1986-01-21 FACILITY: [facility] LOCATION: [facility] PHYSICIAN: Ezzie Buba, MD  Operative Report   DATE OF PROCEDURE: 09/05/2024  PREOPERATIVE DIAGNOSES: 1.  Abnormal uterine bleeding. 2.  Retained products of conception.  POSTOPERATIVE DIAGNOSES: 1.  Abnormal uterine bleeding. 2.  Retained products of conception.  PROCEDURES PERFORMED:  Hysteroscopy with MyoSure resection of retained products.  SURGEON:  Ezzie Buba, MD  ASSISTANT:  None.  ANESTHESIA:  General by LMA with local anesthesia.  ESTIMATED BLOOD LOSS:  (per anesthesia) mL.  IV FLUIDS:  Per Anesthesia.  URINE OUTPUT:  Per anesthesia.  COMPLICATIONS:  None.  PATHOLOGY:  Endometrial curettings, likely retained products of conception, to pathology.  DESCRIPTION OF PROCEDURE:  After informed consent was obtained from the patient and her partner, she was transported to the operating room and placed on the table in the supine position. General anesthesia was induced and upon adequate, she was then  placed in the [place] stirrups, prepped and draped in the normal sterile fashion.  After an appropriate time-out was performed, an open sided speculum was used to visualize her cervix.  A paracervical block of 10 mL of 1% lidocaine  was placed on the  anterior lip of her cervix.  A tenaculum was placed. The cervix was dilated to accommodate the hysteroscope.  The hysteroscope was introduced. Normal-appearing cavity with ostia on the anterior wall of the uterus.  It was noted to be somewhat thickened.   The MyoSure Light resection device was inserted and used to resect this area.  The patient tolerated the procedure well. The instruments were removed from her vagina.  She was returned to the supine position.  The vaginal bleeding was within normal  limits.  Sponge, lap, and needle count was correct x 2 by the operating  staff.    MUK D: 09/05/2024 8:14:24 am T: 09/05/2024 9:30:00 am  JOB: [id number]/ [id number]

## 2024-09-05 NOTE — Anesthesia Procedure Notes (Signed)
 Procedure Name: LMA Insertion Date/Time: 09/05/2024 7:27 AM  Performed by: Viviana Almarie DASEN, CRNAPre-anesthesia Checklist: Patient identified, Emergency Drugs available, Suction available and Patient being monitored Patient Re-evaluated:Patient Re-evaluated prior to induction Oxygen Delivery Method: Circle System Utilized Preoxygenation: Pre-oxygenation with 100% oxygen Induction Type: IV induction Ventilation: Mask ventilation without difficulty LMA: LMA inserted LMA Size: 4.0 Tube type: Oral Number of attempts: 1 Airway Equipment and Method: Bite block Placement Confirmation: positive ETCO2 Tube secured with: Tape Dental Injury: Teeth and Oropharynx as per pre-operative assessment

## 2024-09-05 NOTE — Discharge Instructions (Signed)
     No acetaminophen /Tylenol  until after 12:40 pm today if needed.       Post Anesthesia Home Care Instructions  Activity: Get plenty of rest for the remainder of the day. A responsible individual must stay with you for 24 hours following the procedure.  For the next 24 hours, DO NOT: -Drive a car -Advertising copywriter -Drink alcoholic beverages -Take any medication unless instructed by your physician -Make any legal decisions or sign important papers.  Meals: Start with liquid foods such as gelatin or soup. Progress to regular foods as tolerated. Avoid greasy, spicy, heavy foods. If nausea and/or vomiting occur, drink only clear liquids until the nausea and/or vomiting subsides. Call your physician if vomiting continues.  Special Instructions/Symptoms: Your throat may feel dry or sore from the anesthesia or the breathing tube placed in your throat during surgery. If this causes discomfort, gargle with warm salt water. The discomfort should disappear within 24 hours.  If you had a scopolamine  patch placed behind your ear for the management of post- operative nausea and/or vomiting:  1. The medication in the patch is effective for 72 hours, after which it should be removed.  Wrap patch in a tissue and discard in the trash. Wash hands thoroughly with soap and water. 2. You may remove the patch earlier than 72 hours if you experience unpleasant side effects which may include dry mouth, dizziness or visual disturbances. 3. Avoid touching the patch. Wash your hands with soap and water after contact with the patch.

## 2024-09-05 NOTE — Interval H&P Note (Signed)
 History and Physical Interval Note:  09/05/2024 7:08 AM  Carol May  has presented today for surgery, with the diagnosis of retained products.  The various methods of treatment have been discussed with the patient and family. After consideration of risks, benefits and other options for treatment, the patient has consented to  Procedure(s): DILATATION AND CURETTAGE /HYSTEROSCOPY (N/A) as a surgical intervention.  The patient's history has been reviewed, patient examined, no change in status, stable for surgery.  I have reviewed the patient's chart and labs.  Questions were answered to the patient's satisfaction.     Meghan Tiemann Bovard[place]Stuck[place]

## 2024-09-06 ENCOUNTER — Encounter (HOSPITAL_COMMUNITY): Payer: Self-pay | Admitting: Obstetrics and Gynecology

## 2024-09-10 LAB — SURGICAL PATHOLOGY

## 2025-05-16 ENCOUNTER — Encounter: Admitting: Family Medicine
